# Patient Record
Sex: Female | Born: 1980 | Marital: Single | Smoking: Current every day smoker
Health system: Southern US, Community
[De-identification: ages and names within clinical notes are randomized; demographics above are authoritative.]

## PROBLEM LIST (undated history)

## (undated) ENCOUNTER — Inpatient Hospital Stay (HOSPITAL_COMMUNITY): Payer: Self-pay

## (undated) DIAGNOSIS — R011 Cardiac murmur, unspecified: Secondary | ICD-10-CM

## (undated) HISTORY — PX: TONSILLECTOMY: SUR1361

## (undated) HISTORY — PX: TONSILLECTOMY AND ADENOIDECTOMY: SHX28

## (undated) HISTORY — PX: SKIN GRAFT: SHX250

---

## 2012-12-22 NOTE — L&D Delivery Note (Signed)
Delivery Note Came up from MAU after arriving EMS in active labor. Was ready to push when arrived. No time for IV or other prep.  At 12:14 AM a viable and healthy female was delivered via Vaginal, Spontaneous Delivery (Presentation: OA ).  APGAR: 8, 9; weight 7+10  Placenta status: Intact, Spontaneous.  Cord: 3 vessels with the following complications: None.    Anesthesia: None  Episiotomy: None Lacerations:  Suture Repair: n/a Est. Blood Loss ( ):   Mom to postpartum.  Baby to nursery-stable.  Wynelle Bourgeois 09/28/2013, 2:42 AM

## 2012-12-24 ENCOUNTER — Emergency Department (HOSPITAL_COMMUNITY)
Admission: EM | Admit: 2012-12-24 | Discharge: 2012-12-25 | Disposition: A | Discharge status: Home / Self Care | Payer: Self-pay | Attending: Emergency Medicine | Admitting: Emergency Medicine

## 2012-12-24 ENCOUNTER — Encounter (HOSPITAL_COMMUNITY): Payer: Self-pay | Admitting: *Deleted

## 2012-12-24 DIAGNOSIS — R011 Cardiac murmur, unspecified: Secondary | ICD-10-CM | POA: Insufficient documentation

## 2012-12-24 DIAGNOSIS — Z3202 Encounter for pregnancy test, result negative: Secondary | ICD-10-CM | POA: Insufficient documentation

## 2012-12-24 DIAGNOSIS — N898 Other specified noninflammatory disorders of vagina: Secondary | ICD-10-CM | POA: Insufficient documentation

## 2012-12-24 DIAGNOSIS — F17200 Nicotine dependence, unspecified, uncomplicated: Secondary | ICD-10-CM | POA: Insufficient documentation

## 2012-12-24 HISTORY — DX: Cardiac murmur, unspecified: R01.1

## 2012-12-24 LAB — POCT PREGNANCY, URINE: Preg Test, Ur: NEGATIVE

## 2012-12-24 LAB — URINALYSIS, ROUTINE W REFLEX MICROSCOPIC
Bilirubin Urine: NEGATIVE
Hgb urine dipstick: NEGATIVE
Ketones, ur: NEGATIVE mg/dL
Nitrite: NEGATIVE
Specific Gravity, Urine: 1.019 (ref 1.005–1.030)
Urobilinogen, UA: 0.2 mg/dL (ref 0.0–1.0)

## 2012-12-24 NOTE — ED Provider Notes (Signed)
History     CSN: [id number]  Arrival date & time 12/24/12  2114   First MD Initiated Contact with Patient 12/24/12 2219      Chief Complaint  Patient presents with  . Vaginal Discharge     HPI The patient presents to emergency room complaining of vaginal discharge and itching for the last week. She does not notice any odor. The vaginal discharge is green colored. Patient's concerned she might have an STD. She does not live in this area and does not have a doctor so she came to the emergency department. She has had some lower abdominal cramping. No nausea vomiting fever. Past Medical History  Diagnosis Date  . Heart murmur     Past Surgical History  Procedure Date  . Tonsillectomy     History reviewed. No pertinent family history.  History  Substance Use Topics  . Smoking status: Current Every Day Smoker -- 1.0 packs/day  . Smokeless tobacco: Not on file  . Alcohol Use: Yes    OB History    Grav Para Term Preterm Abortions TAB SAB Ect Mult Living                  Review of Systems  All other systems reviewed and are negative.    Allergies  Review of patient's allergies indicates no known allergies.  Home Medications  No current outpatient prescriptions on file.  BP 113/81  Pulse 98  Temp 98.2 F (36.8 C) (Oral)  Resp 18  SpO2 97%  Physical Exam  Nursing note and vitals reviewed. Constitutional: She appears well-developed and well-nourished. No distress.  HENT:  Head: Normocephalic and atraumatic.  Right Ear: External ear normal.  Left Ear: External ear normal.  Eyes: Conjunctivae normal are normal. Right eye exhibits no discharge. Left eye exhibits no discharge. No scleral icterus.  Neck: Neck supple. No tracheal deviation present.  Cardiovascular: Normal rate, regular rhythm and intact distal pulses.   Pulmonary/Chest: Effort normal and breath sounds normal. No stridor. No respiratory distress. She has no wheezes. She has no rales.  Abdominal:  Soft. Bowel sounds are normal. She exhibits no distension. There is no tenderness. There is no rebound and no guarding.  Genitourinary: Uterus is not tender. Cervix exhibits discharge. Cervix exhibits no motion tenderness. Right adnexum displays no mass. Left adnexum displays no mass. No signs of injury around the vagina. Vaginal discharge found.  Musculoskeletal: She exhibits no edema and no tenderness.  Neurological: She is alert. She has normal strength. No sensory deficit. Cranial nerve deficit:  no gross defecits noted. She exhibits normal muscle tone. She displays no seizure activity. Coordination normal.  Skin: Skin is warm and dry. No rash noted.  Psychiatric: She has a normal mood and affect.    ED Course  Procedures (including critical care time)  Labs Reviewed  URINALYSIS, ROUTINE W REFLEX MICROSCOPIC - Abnormal; Notable for the following:    APPearance CLOUDY (*)     All other components within normal limits  WET PREP, GENITAL - Abnormal; Notable for the following:    Clue Cells Wet Prep HPF POC FEW (*)     WBC, Wet Prep HPF POC FEW (*)     All other components within normal limits  POCT PREGNANCY, URINE  GC/CHLAMYDIA PROBE AMP  RPR   No results found.   1. Vaginal discharge       MDM  Pt with clue cells and vaginal discharge.  Will cover for STDs and treat BV  Celene Kras, MD 12/25/12 [phone]

## 2012-12-24 NOTE — ED Notes (Signed)
Pt c/o vaginal discharge and itching x 1 week.  States discharge is "cream colored" and there is no odor.

## 2012-12-25 LAB — WET PREP, GENITAL: Trich, Wet Prep: NONE SEEN

## 2012-12-25 MED ORDER — AZITHROMYCIN 250 MG PO TABS
1000.0000 mg | ORAL_TABLET | Freq: Once | ORAL | Status: AC
  Administered 2012-12-25: 1000 mg via ORAL
  Filled 2012-12-25: qty 4

## 2012-12-25 MED ORDER — METRONIDAZOLE 500 MG PO TABS: 2000.0000 mg | ORAL_TABLET | Freq: Once | ORAL | Status: DC

## 2012-12-25 MED ORDER — CEFTRIAXONE SODIUM 250 MG IJ SOLR
250.0000 mg | Freq: Once | INTRAMUSCULAR | Status: AC
  Administered 2012-12-25: 250 mg via INTRAMUSCULAR
  Filled 2012-12-25: qty 250

## 2012-12-25 NOTE — ED Notes (Addendum)
Pt and visitor given soda and crackers per request. Updated on plan of care: Aware that blood was just drawn and that when the results are ready the EDP will come and discuss them. No further needs at this time.

## 2012-12-25 NOTE — ED Notes (Signed)
Pt A.O. X 4. NAD. Verbalized understanding of diagnosis. Verbalized need to have sexual partners tested. Verbalized warning signs that infection has worsened or returned. Vitals stable. Skin warm and dry. Ambulatory. Respirations even and regular. No further needs at this time.

## 2012-12-26 LAB — GC/CHLAMYDIA PROBE AMP: CT Probe RNA: NEGATIVE

## 2013-05-19 ENCOUNTER — Emergency Department (HOSPITAL_COMMUNITY): Payer: Medicaid Other

## 2013-05-19 ENCOUNTER — Encounter (HOSPITAL_COMMUNITY): Payer: Self-pay | Admitting: Emergency Medicine

## 2013-05-19 ENCOUNTER — Emergency Department (HOSPITAL_COMMUNITY)
Admission: EM | Admit: 2013-05-19 | Discharge: 2013-05-19 | Disposition: A | Discharge status: Home / Self Care | Payer: Medicaid Other | Attending: Emergency Medicine | Admitting: Emergency Medicine

## 2013-05-19 DIAGNOSIS — N949 Unspecified condition associated with female genital organs and menstrual cycle: Secondary | ICD-10-CM | POA: Insufficient documentation

## 2013-05-19 DIAGNOSIS — N760 Acute vaginitis: Secondary | ICD-10-CM | POA: Insufficient documentation

## 2013-05-19 DIAGNOSIS — R011 Cardiac murmur, unspecified: Secondary | ICD-10-CM | POA: Insufficient documentation

## 2013-05-19 DIAGNOSIS — N390 Urinary tract infection, site not specified: Secondary | ICD-10-CM | POA: Insufficient documentation

## 2013-05-19 DIAGNOSIS — B9689 Other specified bacterial agents as the cause of diseases classified elsewhere: Secondary | ICD-10-CM

## 2013-05-19 DIAGNOSIS — O2390 Unspecified genitourinary tract infection in pregnancy, unspecified trimester: Secondary | ICD-10-CM | POA: Insufficient documentation

## 2013-05-19 DIAGNOSIS — N898 Other specified noninflammatory disorders of vagina: Secondary | ICD-10-CM | POA: Insufficient documentation

## 2013-05-19 DIAGNOSIS — R000 Tachycardia, unspecified: Secondary | ICD-10-CM | POA: Insufficient documentation

## 2013-05-19 DIAGNOSIS — O9989 Other specified diseases and conditions complicating pregnancy, childbirth and the puerperium: Secondary | ICD-10-CM | POA: Insufficient documentation

## 2013-05-19 DIAGNOSIS — O99330 Smoking (tobacco) complicating pregnancy, unspecified trimester: Secondary | ICD-10-CM | POA: Insufficient documentation

## 2013-05-19 DIAGNOSIS — R109 Unspecified abdominal pain: Secondary | ICD-10-CM | POA: Insufficient documentation

## 2013-05-19 LAB — TYPE AND SCREEN: ABO/RH(D): O NEG

## 2013-05-19 LAB — URINALYSIS, ROUTINE W REFLEX MICROSCOPIC
Glucose, UA: NEGATIVE mg/dL
Hgb urine dipstick: NEGATIVE
Ketones, ur: NEGATIVE mg/dL
Protein, ur: NEGATIVE mg/dL
Urobilinogen, UA: 0.2 mg/dL (ref 0.0–1.0)

## 2013-05-19 LAB — WET PREP, GENITAL

## 2013-05-19 LAB — POCT PREGNANCY, URINE: Preg Test, Ur: POSITIVE — AB

## 2013-05-19 MED ORDER — RHO D IMMUNE GLOBULIN 1500 UNIT/2ML IJ SOLN
300.0000 ug | Freq: Once | INTRAMUSCULAR | Status: AC
  Administered 2013-05-19: 300 ug via INTRAMUSCULAR

## 2013-05-19 MED ORDER — SODIUM CHLORIDE 0.9 % IV SOLN
1000.0000 mL | INTRAVENOUS | Status: DC
  Administered 2013-05-19: 1000 mL via INTRAVENOUS

## 2013-05-19 MED ORDER — CEFTRIAXONE SODIUM 250 MG IJ SOLR
250.0000 mg | INTRAMUSCULAR | Status: DC
  Administered 2013-05-19: 250 mg via INTRAMUSCULAR
  Filled 2013-05-19: qty 250

## 2013-05-19 MED ORDER — AZITHROMYCIN 1 G PO PACK
1.0000 g | PACK | Freq: Once | ORAL | Status: AC
  Administered 2013-05-19: 1 g via ORAL
  Filled 2013-05-19: qty 1

## 2013-05-19 MED ORDER — CEPHALEXIN 500 MG PO CAPS: 500.0000 mg | ORAL_CAPSULE | Freq: Four times a day (QID) | ORAL | Status: DC

## 2013-05-19 MED ORDER — METRONIDAZOLE 500 MG PO TABS: 500.0000 mg | ORAL_TABLET | Freq: Two times a day (BID) | ORAL | Status: DC

## 2013-05-19 NOTE — Progress Notes (Signed)
[facility] has seen patient and provided her with pc resources.

## 2013-05-19 NOTE — ED Notes (Signed)
Per patient, abdominal pain, vaginal discharge/odor for past couple of days-thinks she is 4-5 months pregnant-states small amount of spotting last night-has not felt the baby move in 2 days-is not currently receiving prenatal care

## 2013-05-19 NOTE — ED Provider Notes (Addendum)
History     CSN: [id number]  Arrival date & time 05/19/13  1535   First MD Initiated Contact with Patient 05/19/13 1601      Chief Complaint  Patient presents with  . Abdominal Pain    (Consider location/radiation/quality/duration/timing/severity/associated sxs/prior treatment) Patient is a 32 y.o. female presenting with abdominal pain. The history is provided by the patient.  Abdominal Pain Associated symptoms include abdominal pain.   patient here complaining of lower abdominal pain with vaginal discharge x3 days. Process appeared was in January and she states that she had a recent stress test that was positive. She is G5 P3 with one miscarriage. She notes some vaginal spotting without uterine cramping. No fever chills. Has had emesis times one. Is not taking prenatal vitamins has not had any prenatal care  Past Medical History  Diagnosis Date  . Heart murmur     Past Surgical History  Procedure Laterality Date  . Tonsillectomy      No family history on file.  History  Substance Use Topics  . Smoking status: Current Every Day Smoker -- 1.00 packs/day  . Smokeless tobacco: Not on file  . Alcohol Use: Yes    OB History   Grav Para Term Preterm Abortions TAB SAB Ect Mult Living   1               Review of Systems  Gastrointestinal: Positive for abdominal pain.  All other systems reviewed and are negative.    Allergies  Review of patient's allergies indicates no known allergies.  Home Medications  No current outpatient prescriptions on file.  BP 121/66  Pulse 108  Temp(Src) 98.8 F (37.1 C) (Oral)  Resp 22  SpO2 98%  Physical Exam  Nursing note and vitals reviewed. Constitutional: She is oriented to person, place, and time. She appears well-developed and well-nourished.  Non-toxic appearance. No distress.  HENT:  Head: Normocephalic and atraumatic.  Eyes: Conjunctivae, EOM and lids are normal. Pupils are equal, round, and reactive to light.  Neck:  Normal range of motion. Neck supple. No tracheal deviation present. No mass present.  Cardiovascular: Regular rhythm and normal heart sounds.  Tachycardia present.  Exam reveals no gallop.   No murmur heard. Pulmonary/Chest: Effort normal and breath sounds normal. No stridor. No respiratory distress. She has no decreased breath sounds. She has no wheezes. She has no rhonchi. She has no rales.  Abdominal: Soft. Normal appearance and bowel sounds are normal. She exhibits no distension. There is tenderness in the suprapubic area. There is no rigidity, no rebound, no guarding and no CVA tenderness.  Genitourinary: There is no rash on the right labia. No bleeding around the vagina. Vaginal discharge found.  Musculoskeletal: Normal range of motion. She exhibits no edema and no tenderness.  Neurological: She is alert and oriented to person, place, and time. She has normal strength. No cranial nerve deficit or sensory deficit. GCS eye subscore is 4. GCS verbal subscore is 5. GCS motor subscore is 6.  Skin: Skin is warm and dry. No abrasion and no rash noted.  Psychiatric: She has a normal mood and affect. Her speech is normal and behavior is normal.    ED Course  Procedures (including critical care time)  Labs Reviewed  WET PREP, GENITAL  GC/CHLAMYDIA PROBE AMP  URINALYSIS, ROUTINE W REFLEX MICROSCOPIC  HCG, QUANTITATIVE, PREGNANCY   No results found.   No diagnosis found.    MDM  Patient states that she is concerned that she  have contracted an STI do to the fact that the condom broke. Patient to be given dose of Rocephin and Zithromax. She has a UTI as well as bacterial vaginosis. Patient to be treated for that as well   Patient is also Rh negative and was given RhoGAM    Toy Baker, MD 05/19/13 1820  Toy Baker, MD 05/19/13 [phone]

## 2013-05-20 LAB — RH IG WORKUP (INCLUDES ABO/RH)
ABO/RH(D): O NEG
Unit division: 0
Unit tag comment: 25

## 2013-05-20 LAB — URINE CULTURE: Colony Count: NO GROWTH

## 2013-05-20 LAB — GC/CHLAMYDIA PROBE AMP: GC Probe RNA: NEGATIVE

## 2013-08-09 ENCOUNTER — Inpatient Hospital Stay (HOSPITAL_COMMUNITY)
Admission: AD | Admit: 2013-08-09 | Discharge: 2013-08-09 | Disposition: A | Discharge status: Home / Self Care | Payer: Medicaid Other | Source: Ambulatory Visit | Attending: Obstetrics & Gynecology | Admitting: Obstetrics & Gynecology

## 2013-08-09 ENCOUNTER — Encounter (HOSPITAL_COMMUNITY): Payer: Self-pay

## 2013-08-09 ENCOUNTER — Inpatient Hospital Stay (HOSPITAL_COMMUNITY): Payer: Medicaid Other

## 2013-08-09 DIAGNOSIS — O0930 Supervision of pregnancy with insufficient antenatal care, unspecified trimester: Secondary | ICD-10-CM | POA: Insufficient documentation

## 2013-08-09 DIAGNOSIS — Z3490 Encounter for supervision of normal pregnancy, unspecified, unspecified trimester: Secondary | ICD-10-CM

## 2013-08-09 DIAGNOSIS — O368190 Decreased fetal movements, unspecified trimester, not applicable or unspecified: Secondary | ICD-10-CM | POA: Insufficient documentation

## 2013-08-09 DIAGNOSIS — N930 Postcoital and contact bleeding: Secondary | ICD-10-CM

## 2013-08-09 DIAGNOSIS — N949 Unspecified condition associated with female genital organs and menstrual cycle: Secondary | ICD-10-CM | POA: Insufficient documentation

## 2013-08-09 DIAGNOSIS — O26899 Other specified pregnancy related conditions, unspecified trimester: Secondary | ICD-10-CM

## 2013-08-09 DIAGNOSIS — F1410 Cocaine abuse, uncomplicated: Secondary | ICD-10-CM | POA: Insufficient documentation

## 2013-08-09 DIAGNOSIS — O4690 Antepartum hemorrhage, unspecified, unspecified trimester: Secondary | ICD-10-CM | POA: Insufficient documentation

## 2013-08-09 DIAGNOSIS — R109 Unspecified abdominal pain: Secondary | ICD-10-CM | POA: Insufficient documentation

## 2013-08-09 DIAGNOSIS — O99340 Other mental disorders complicating pregnancy, unspecified trimester: Secondary | ICD-10-CM | POA: Insufficient documentation

## 2013-08-09 LAB — RPR: RPR Ser Ql: NONREACTIVE

## 2013-08-09 LAB — CBC
HCT: 36.7 % (ref 36.0–46.0)
Hemoglobin: 12.3 g/dL (ref 12.0–15.0)
MCV: 94.6 fL (ref 78.0–100.0)
RBC: 3.88 MIL/uL (ref 3.87–5.11)
WBC: 12.2 10*3/uL — ABNORMAL HIGH (ref 4.0–10.5)

## 2013-08-09 LAB — DIFFERENTIAL
Basophils Absolute: 0 10*3/uL (ref 0.0–0.1)
Eosinophils Relative: 1 % (ref 0–5)
Lymphocytes Relative: 22 % (ref 12–46)
Lymphs Abs: 2.7 10*3/uL (ref 0.7–4.0)
Monocytes Absolute: 0.9 10*3/uL (ref 0.1–1.0)
Monocytes Relative: 8 % (ref 3–12)
Neutro Abs: 8.4 10*3/uL — ABNORMAL HIGH (ref 1.7–7.7)

## 2013-08-09 LAB — RAPID URINE DRUG SCREEN, HOSP PERFORMED
Amphetamines: NOT DETECTED
Barbiturates: NOT DETECTED
Benzodiazepines: NOT DETECTED
Cocaine: POSITIVE — AB

## 2013-08-09 LAB — URINALYSIS, ROUTINE W REFLEX MICROSCOPIC
Bilirubin Urine: NEGATIVE
Hgb urine dipstick: NEGATIVE
Ketones, ur: NEGATIVE mg/dL
Nitrite: NEGATIVE
pH: 7 (ref 5.0–8.0)

## 2013-08-09 LAB — OB RESULTS CONSOLE GC/CHLAMYDIA
Chlamydia: NEGATIVE
Gonorrhea: NEGATIVE

## 2013-08-09 LAB — GLUCOSE TOLERANCE, 1 HOUR: Glucose, 1 Hour GTT: 188 mg/dL — ABNORMAL HIGH (ref 70–140)

## 2013-08-09 LAB — WET PREP, GENITAL: Trich, Wet Prep: NONE SEEN

## 2013-08-09 MED ORDER — RHO D IMMUNE GLOBULIN 1500 UNIT/2ML IJ SOLN
300.0000 ug | Freq: Once | INTRAMUSCULAR | Status: AC
  Administered 2013-08-09: 300 ug via INTRAMUSCULAR
  Filled 2013-08-09: qty 2

## 2013-08-09 MED ORDER — CALCIUM CARBONATE ANTACID 500 MG PO CHEW: 1.0000 | CHEWABLE_TABLET | Freq: Every day | ORAL | Status: DC

## 2013-08-09 NOTE — Progress Notes (Signed)
D. Poe CNM in to discuss test results and d/c plan. Written and verbal d/c instructions given and understanding voiced.

## 2013-08-09 NOTE — MAU Note (Signed)
1/2 bottle of glucola drank and lab called. Will draw pt's labs at 1400. Pt aware. Pt now states has spotting after IC.

## 2013-08-09 NOTE — MAU Note (Signed)
Patient states she has recently moved to [place]. Has had no prenatal care. States she has felt decreased fetal movement. Had vomiting yesterday and had a little discharge with vomiting. Has had some spotting, none today. Has some mid abdominal pain off and on.

## 2013-08-09 NOTE — MAU Note (Signed)
Rhophylac literature given to pt. Pt has had it before and voices understanding

## 2013-08-09 NOTE — MAU Provider Note (Signed)
History    CSN: [id number]  Arrival date and time: 08/09/13 1130   First Provider Initiated Contact with Patient 08/09/13 1215      Chief Complaint  Patient presents with  . Decreased Fetal Movement  . Abdominal Pain  . Emesis  . Vaginal Discharge   HPI Comments: [name] [name] is a 32 y.o. [id number] presents today for multiple complaints.  She is [redacted]w[redacted]d by [place]. She moved from [place] recently and has not been able to establish prenatal care for this pregnancy.  She complains of post coital spotting x 3 last week.  She describes the spotting as minimal and light pink, and it is found on the toilet paper after wiping.  She denies any other vaginal discharge.  She complains of abdominal cramping in the right and left lower quadrant that began two days ago.  It occurs 1 to 2 times per hour and she rates the severity as 4-5.  The timing of the contractions is unpredictable. She has had some nausea with this pregnancy, but she denies it today. She has a negative health history for hypertension and diabetes.  She smokes 1 ppd, reports using marijuana, and denies alcohol.        Past Medical History  Diagnosis Date  . Heart murmur     Past Surgical History  Procedure Laterality Date  . Tonsillectomy      Family History  Problem Relation Age of Onset  . Alcohol abuse Neg Hx     History  Substance Use Topics  . Smoking status: Current Every Day Smoker -- 1.00 packs/day  . Smokeless tobacco: Not on file  . Alcohol Use: No    Allergies: No Known Allergies  Prescriptions prior to admission  Medication Sig Dispense Refill  . Prenatal Vit-Fe Fumarate-FA (MULTIVITAMIN-PRENATAL) 27-0.8 MG TABS tablet Take 1 tablet by mouth daily at 12 noon.        Review of Systems  Constitutional: Negative for fever, chills, weight loss, malaise/fatigue and diaphoresis.  HENT: Negative for hearing loss.   Eyes: Negative for blurred vision and double vision.  Respiratory: Negative for cough,  hemoptysis, sputum production, shortness of breath and wheezing.   Cardiovascular: Negative for chest pain, palpitations, orthopnea, claudication and PND.  Gastrointestinal: Positive for abdominal pain. Negative for heartburn, nausea, vomiting, diarrhea and constipation.  Genitourinary: Negative for dysuria, urgency and frequency.  Skin: Negative for itching.  Neurological: Negative for dizziness, tingling, tremors, sensory change, speech change, weakness and headaches.   Physical Exam   Blood pressure 120/86, pulse 95, temperature 98.1 F (36.7 C), temperature source Oral, resp. rate 20, height 5\' 5"  (1.651 m), weight 104.418 kg (230 lb 3.2 oz), SpO2 97.00%.  Physical Exam  Constitutional: She is oriented to person, place, and time. She appears well-developed and well-nourished. No distress.  Eyes: Right eye exhibits no discharge. Left eye exhibits no discharge.  Neck: Neck supple. No tracheal deviation present.  Cardiovascular: Normal rate, regular rhythm, normal heart sounds and intact distal pulses.   Respiratory: Effort normal and breath sounds normal.  GI: Soft. Bowel sounds are normal.  Neurological: She is alert and oriented to person, place, and time. She has normal reflexes. No cranial nerve deficit or sensory deficit.  Skin: Skin is warm, dry and intact. Lesion ( There is a round, 1 cm erythematous lesion just above the right gluteal fold.  ) noted. She is not diaphoretic.  Psychiatric: She has a normal mood and affect. Her speech is normal and behavior is  normal. Judgment and thought content normal. Cognition and memory are normal.    MAU Course  Procedures Results for orders placed during the hospital encounter of 08/09/13 (from the past 24 hour(s))  URINALYSIS, ROUTINE W REFLEX MICROSCOPIC     Status: Abnormal   Collection Time    08/09/13 11:55 AM      Result Value Range   Color, Urine YELLOW  YELLOW   APPearance CLOUDY (*) CLEAR   Specific Gravity, Urine 1.020  1.005  - 1.030   pH 7.0  5.0 - 8.0   Glucose, UA NEGATIVE  NEGATIVE mg/dL   Hgb urine dipstick NEGATIVE  NEGATIVE   Bilirubin Urine NEGATIVE  NEGATIVE   Ketones, ur NEGATIVE  NEGATIVE mg/dL   Protein, ur NEGATIVE  NEGATIVE mg/dL   Urobilinogen, UA 0.2  0.0 - 1.0 mg/dL   Nitrite NEGATIVE  NEGATIVE   Leukocytes, UA NEGATIVE  NEGATIVE  URINE RAPID DRUG SCREEN (HOSP PERFORMED)     Status: Abnormal   Collection Time    08/09/13 11:55 AM      Result Value Range   Opiates NONE DETECTED  NONE DETECTED   Cocaine POSITIVE (*) NONE DETECTED   Benzodiazepines NONE DETECTED  NONE DETECTED   Amphetamines NONE DETECTED  NONE DETECTED   Tetrahydrocannabinol POSITIVE (*) NONE DETECTED   Barbiturates NONE DETECTED  NONE DETECTED  WET PREP, GENITAL     Status: None   Collection Time    08/09/13  1:00 PM      Result Value Range   Yeast Wet Prep HPF POC NONE SEEN  NONE SEEN   Trich, Wet Prep NONE SEEN  NONE SEEN   Clue Cells Wet Prep HPF POC NONE SEEN  NONE SEEN   WBC, Wet Prep HPF POC NONE SEEN  NONE SEEN  RH IG WORKUP (INCLUDES ABO/RH)     Status: None   Collection Time    08/09/13  2:02 PM      Result Value Range   Gestational Age(Wks) 32     ABO/RH(D) O NEG     Fetal Screen PENDING     Antibody Screen PENDING    CBC     Status: Abnormal   Collection Time    08/09/13  2:02 PM      Result Value Range   WBC 12.2 (*) 4.0 - 10.5 K/uL   RBC 3.88  3.87 - 5.11 MIL/uL   Hemoglobin 12.3  12.0 - 15.0 g/dL   HCT 13.0  86.5 - 78.4 %   MCV 94.6  78.0 - 100.0 fL   MCH 31.7  26.0 - 34.0 pg   MCHC 33.5  30.0 - 36.0 g/dL   RDW 69.6  29.5 - 28.4 %   Platelets 219  150 - 400 K/uL  DIFFERENTIAL     Status: Abnormal   Collection Time    08/09/13  2:02 PM      Result Value Range   Neutrophils Relative % 69  43 - 77 %   Neutro Abs 8.4 (*) 1.7 - 7.7 K/uL   Lymphocytes Relative 22  12 - 46 %   Lymphs Abs 2.7  0.7 - 4.0 K/uL   Monocytes Relative 8  3 - 12 %   Monocytes Absolute 0.9  0.1 - 1.0 K/uL    Eosinophils Relative 1  0 - 5 %   Eosinophils Absolute 0.2  0.0 - 0.7 K/uL   Basophils Relative 0  0 - 1 %   Basophils Absolute  0.0  0.0 - 0.1 K/uL      Assessment and Plan   Problem List Items Addressed This Visit   None    Visit Diagnoses   PCB (post coital bleeding)    -  Primary    Abdominal pain in pregnancy        Normal IUP (intrauterine pregnancy) on prenatal ultrasound          Abdominal pain / Tylenol OTC1-2 tabs PO q4-6h prn Post coital bleeding / Follow up in GYN clinic as scheduled Normal IUP at 33w / Continue to follow up at GYN clinic for scheduled appointments.     CLARK, MICHAEL L 08/09/2013, 3:13 PM   Evaluation and management procedures were performed by PA-S under my supervision/collaboration. Chart reviewed, patient examined by me and I agree with management and plan except also:  32 yo G4P2012 at [redacted]w[redacted]d dated by 20 wk scan NPC  Substance abuse (cocaine, marijuana)   Abnormal 1 hr GTT (188) > 3hr GTT this week Rh neg> Rhophylac today  Follow-up Information   Follow up with [facility] On 08/25/2013. Vira Browns from [facility] will call you with appointment)    Specialty:  Obstetrics and Gynecology   Contact information:   [address] [place] [address] [phone]       Medication List    STOP taking these medications       bismuth subsalicylate 262 MG chewable tablet  Commonly known as:  PEPTO BISMOL      TAKE these medications       calcium carbonate 500 MG chewable tablet  Commonly known as:  TUMS  Chew 1 tablet (200 mg of elemental calcium total) by mouth daily.     multivitamin-prenatal 27-0.8 MG Tabs tablet  Take 1 tablet by mouth daily at 12 noon.       Danae Orleans, CNM 08/09/2013 4:33 PM

## 2013-08-09 NOTE — MAU Note (Signed)
Pt vomited and friend states she does this everyday. Pt hungry and requests ginger ale. Crackers and ginger ale given

## 2013-08-09 NOTE — MAU Note (Signed)
OK not to reapply EFM per D. Poe CNM when returns from u/s

## 2013-08-10 LAB — RH IG WORKUP (INCLUDES ABO/RH)
ABO/RH(D): O NEG
Antibody Screen: NEGATIVE
Fetal Screen: NEGATIVE
Unit division: 0

## 2013-08-10 LAB — GC/CHLAMYDIA PROBE AMP: CT Probe RNA: NEGATIVE

## 2013-08-11 NOTE — MAU Provider Note (Signed)
Attestation of Attending Supervision of Advanced Practitioner (CNM/NP): Evaluation and management procedures were performed by the Advanced Practitioner under my supervision and collaboration.  I have reviewed the Advanced Practitioner's note and chart, and I agree with the management and plan.  HARRAWAY-SMITH, Gabrial Domine 2:13 PM     

## 2013-08-15 ENCOUNTER — Telehealth: Payer: Self-pay | Admitting: General Practice

## 2013-08-15 NOTE — Telephone Encounter (Signed)
Message copied by Kathee Delton on Mon Aug 15, 2013  3:44 PM ------      Message from: Danae Orleans      Created: Tue Aug 09, 2013  4:34 PM       I think we have already set her up for appointment. (33 wks, NPC, substance abuse) Had 188 on 1 hr so should come for 3 hr this week. US done today. Had Rhophylac today ------

## 2013-08-15 NOTE — Telephone Encounter (Signed)
Spoke with patient she stated that she will come in on Wednesday at 730. Advised patient that she will need to be fasting.

## 2013-08-15 NOTE — Telephone Encounter (Signed)
Called patient, and the patient's sister answered and asked who is calling. I stated its a nurse from the hospital needing to speak with her and is she available. The patient's sister stated that she was not at home at the moment but when she gets home she will have [name] [name] call us back.

## 2013-08-17 ENCOUNTER — Other Ambulatory Visit: Payer: Self-pay

## 2013-08-25 ENCOUNTER — Ambulatory Visit (INDEPENDENT_AMBULATORY_CARE_PROVIDER_SITE_OTHER): Payer: Medicaid Other | Admitting: Family Medicine

## 2013-08-25 ENCOUNTER — Other Ambulatory Visit (HOSPITAL_COMMUNITY)
Admission: RE | Admit: 2013-08-25 | Discharge: 2013-08-25 | Disposition: A | Discharge status: Home / Self Care | Payer: Medicaid Other | Source: Ambulatory Visit | Attending: Family Medicine | Admitting: Family Medicine

## 2013-08-25 ENCOUNTER — Encounter: Payer: Self-pay | Admitting: Family Medicine

## 2013-08-25 VITALS — BP 139/86 | Temp 97.1°F | Ht 67.0 in | Wt 227.0 lb

## 2013-08-25 DIAGNOSIS — Z6791 Unspecified blood type, Rh negative: Secondary | ICD-10-CM | POA: Insufficient documentation

## 2013-08-25 DIAGNOSIS — Z113 Encounter for screening for infections with a predominantly sexual mode of transmission: Secondary | ICD-10-CM | POA: Insufficient documentation

## 2013-08-25 DIAGNOSIS — O0930 Supervision of pregnancy with insufficient antenatal care, unspecified trimester: Secondary | ICD-10-CM

## 2013-08-25 DIAGNOSIS — O99332 Smoking (tobacco) complicating pregnancy, second trimester: Secondary | ICD-10-CM

## 2013-08-25 DIAGNOSIS — Z01419 Encounter for gynecological examination (general) (routine) without abnormal findings: Secondary | ICD-10-CM | POA: Insufficient documentation

## 2013-08-25 DIAGNOSIS — O99330 Smoking (tobacco) complicating pregnancy, unspecified trimester: Secondary | ICD-10-CM

## 2013-08-25 DIAGNOSIS — O360131 Maternal care for anti-D [Rh] antibodies, third trimester, fetus 1: Secondary | ICD-10-CM

## 2013-08-25 DIAGNOSIS — O360990 Maternal care for other rhesus isoimmunization, unspecified trimester, not applicable or unspecified: Secondary | ICD-10-CM

## 2013-08-25 DIAGNOSIS — Z1151 Encounter for screening for human papillomavirus (HPV): Secondary | ICD-10-CM | POA: Insufficient documentation

## 2013-08-25 DIAGNOSIS — O0933 Supervision of pregnancy with insufficient antenatal care, third trimester: Secondary | ICD-10-CM | POA: Insufficient documentation

## 2013-08-25 DIAGNOSIS — O99810 Abnormal glucose complicating pregnancy: Secondary | ICD-10-CM

## 2013-08-25 LAB — POCT URINALYSIS DIP (DEVICE)
Bilirubin Urine: NEGATIVE
Ketones, ur: NEGATIVE mg/dL
Leukocytes, UA: NEGATIVE
pH: 6.5 (ref 5.0–8.0)

## 2013-08-25 MED ORDER — TETANUS-DIPHTH-ACELL PERTUSSIS 5-2.5-18.5 LF-MCG/0.5 IM SUSP
0.5000 mL | Freq: Once | INTRAMUSCULAR | Status: AC
  Administered 2013-08-25: 0.5 mL via INTRAMUSCULAR

## 2013-08-25 NOTE — Progress Notes (Signed)
   Subjective:    [name] [name] is a [id number] [redacted]w[redacted]d being seen today for her first obstetrical visit.  Her obstetrical history is significant for excessive weight gain, obesity, smoker and late to care, Rh neg.  Pregnancy history fully reviewed.  Patient reports occasional contractions.  Filed Vitals:   08/25/13 1018  BP: 139/86  Temp: 97.1 F (36.2 C)  Weight: 227 lb (102.967 kg)    HISTORY: OB History  Gravida Para Term Preterm AB SAB TAB Ectopic Multiple Living  5 3 3  1 1    3     # Outcome Date GA Lbr Len/2nd Weight Sex Delivery Anes PTL Lv  5 CUR           4 TRM 12/04/09 [redacted]w[redacted]d   F SVD None  Y  3 TRM 07/24/07 [redacted]w[redacted]d  6 lb 7 oz (2.92 kg) F SVD EPI  Y  2 SAB 2008          1 TRM 02/13/02 [redacted]w[redacted]d  6 lb 7 oz (2.92 kg) M SVD EPI  Y     Past Medical History  Diagnosis Date  . Heart murmur    Past Surgical History  Procedure Laterality Date  . Tonsillectomy    . Skin graft    . Tonsillectomy and adenoidectomy     Family History  Problem Relation Age of Onset  . Alcohol abuse Neg Hx   . Diabetes Maternal Grandmother      Exam    Uterus:   FH 35  Pelvic Exam:    Perineum: Normal Perineum   Vulva: Bartholin's, Urethra, Skene's normal   Vagina:  normal mucosa, normal discharge   Cervix: multiparous appearance   Adnexa: normal adnexa   Bony Pelvis: average  System: Breast:  normal appearance, no masses or tenderness   Skin: normal coloration and turgor, no rashes    Neurologic: oriented   Extremities: normal strength, tone, and muscle mass   HEENT sclera clear, anicteric   Mouth/Teeth mucous membranes moist, pharynx normal without lesions   Neck supple   Cardiovascular: regular rate and rhythm, no murmurs or gallops   Respiratory:  appears well, vitals normal, no respiratory distress, acyanotic, normal RR, ear and throat exam is normal, neck free of mass or lymphadenopathy, chest clear, no wheezing, crepitations, rhonchi, normal symmetric air entry   Abdomen:  soft, non-tender; bowel sounds normal; no masses,  no organomegaly   U/S shows: vtx, AFI 18, EFW 4 lb 12 oz 79% 8/19   Assessment:    Pregnancy: [id number] Patient Active Problem List   Diagnosis Date Noted  . Rh negative state in antepartum period 08/25/2013  . Late prenatal care complicating pregnancy in third trimester 08/25/2013  . Tobacco use complicating pregnancy 08/25/2013        Plan:     Initial labs drawn. Prenatal vitamins. Problem list reviewed and updated. Genetic Screening discussed First Screen: too late.  Ultrasound discussed; fetal survey: results reviewed.  Follow up in 2 weeks. 3 hour ASAP TDaP.     PRATT,TANYA S 08/25/2013

## 2013-08-25 NOTE — Progress Notes (Signed)
Nutrition note: 1st visit consult Pt has gained 48# @ [redacted]w[redacted]d, which is > expected. Pt reports eating 3 meals & 2-3 snacks/d.  Pt is taking PNV. Pt reports occ heartburn but no N/V. Pt received verbal & written education on general nutrition during pregnancy. Discussed tips to decrease heartburn. Discussed benefits of BF. Encouraged decreasing portion sizes. Discussed wt gain goals of 15-25# or 0.6#/wk. Pt agrees to continue taking PNV. Pt does not have WIC but plans to apply. Pt is unsure about BF. F/u if referred Blondell Reveal, MS, RD, LDN

## 2013-08-25 NOTE — Progress Notes (Signed)
Pulse- 84  Pain/pressure-bladder  New ob packet given Weight gain 11-20lbs

## 2013-08-25 NOTE — Patient Instructions (Addendum)
Smoking Cessation Quitting smoking is important to your health and has many advantages. However, it is not always easy to quit since nicotine is a very addictive drug. Often times, people try 3 times or more before being able to quit. This document explains the best ways for you to prepare to quit smoking. Quitting takes hard work and a lot of effort, but you can do it. ADVANTAGES OF QUITTING SMOKING  You will live longer, feel better, and live better.  Your body will feel the impact of quitting smoking almost immediately.  Within 20 minutes, blood pressure decreases. Your pulse returns to its normal level.  After 8 hours, carbon monoxide levels in the blood return to normal. Your oxygen level increases.  After 24 hours, the chance of having a heart attack starts to decrease. Your breath, hair, and body stop smelling like smoke.  After 48 hours, damaged nerve endings begin to recover. Your sense of taste and smell improve.  After 72 hours, the body is virtually free of nicotine. Your bronchial tubes relax and breathing becomes easier.  After 2 to 12 weeks, lungs can hold more air. Exercise becomes easier and circulation improves.  The risk of having a heart attack, stroke, cancer, or lung disease is greatly reduced.  After 1 year, the risk of coronary heart disease is cut in half.  After 5 years, the risk of stroke falls to the same as a nonsmoker.  After 10 years, the risk of lung cancer is cut in half and the risk of other cancers decreases significantly.  After 15 years, the risk of coronary heart disease drops, usually to the level of a nonsmoker.  If you are pregnant, quitting smoking will improve your chances of having a healthy baby.  The people you live with, especially any children, will be healthier.  You will have extra money to spend on things other than cigarettes. QUESTIONS TO THINK ABOUT BEFORE ATTEMPTING TO QUIT You may want to talk about your answers with your  caregiver.  Why do you want to quit?  If you tried to quit in the past, what helped and what did not?  What will be the most difficult situations for you after you quit? How will you plan to handle them?  Who can help you through the tough times? Your family? Friends? A caregiver?  What pleasures do you get from smoking? What ways can you still get pleasure if you quit? Here are some questions to ask your caregiver:  How can you help me to be successful at quitting?  What medicine do you think would be best for me and how should I take it?  What should I do if I need more help?  What is smoking withdrawal like? How can I get information on withdrawal? GET READY  Set a quit date.  Change your environment by getting rid of all cigarettes, ashtrays, matches, and lighters in your home, car, or work. Do not let people smoke in your home.  Review your past attempts to quit. Think about what worked and what did not. GET SUPPORT AND ENCOURAGEMENT You have a better chance of being successful if you have help. You can get support in many ways.  Tell your family, friends, and co-workers that you are going to quit and need their support. Ask them not to smoke around you.  Get individual, group, or telephone counseling and support. Programs are available at local hospitals and health centers. Call your local health department for   information about programs in your area.  Spiritual beliefs and practices may help some smokers quit.  Download a "quit meter" on your computer to keep track of quit statistics, such as how long you have gone without smoking, cigarettes not smoked, and money saved.  Get a self-help book about quitting smoking and staying off of tobacco. LEARN NEW SKILLS AND BEHAVIORS  Distract yourself from urges to smoke. Talk to someone, go for a walk, or occupy your time with a task.  Change your normal routine. Take a different route to work. Drink tea instead of coffee.  Eat breakfast in a different place.  Reduce your stress. Take a hot bath, exercise, or read a book.  Plan something enjoyable to do every day. Reward yourself for not smoking.  Explore interactive web-based programs that specialize in helping you quit. GET MEDICINE AND USE IT CORRECTLY Medicines can help you stop smoking and decrease the urge to smoke. Combining medicine with the above behavioral methods and support can greatly increase your chances of successfully quitting smoking.  Nicotine replacement therapy helps deliver nicotine to your body without the negative effects and risks of smoking. Nicotine replacement therapy includes nicotine gum, lozenges, inhalers, nasal sprays, and skin patches. Some may be available over-the-counter and others require a prescription.  Antidepressant medicine helps people abstain from smoking, but how this works is unknown. This medicine is available by prescription.  Nicotinic receptor partial agonist medicine simulates the effect of nicotine in your brain. This medicine is available by prescription. Ask your caregiver for advice about which medicines to use and how to use them based on your health history. Your caregiver will tell you what side effects to look out for if you choose to be on a medicine or therapy. Carefully read the information on the package. Do not use any other product containing nicotine while using a nicotine replacement product.  RELAPSE OR DIFFICULT SITUATIONS Most relapses occur within the first 3 months after quitting. Do not be discouraged if you start smoking again. Remember, most people try several times before finally quitting. You may have symptoms of withdrawal because your body is used to nicotine. You may crave cigarettes, be irritable, feel very hungry, cough often, get headaches, or have difficulty concentrating. The withdrawal symptoms are only temporary. They are strongest when you first quit, but they will go away within  10 14 days. To reduce the chances of relapse, try to:  Avoid drinking alcohol. Drinking lowers your chances of successfully quitting.  Reduce the amount of caffeine you consume. Once you quit smoking, the amount of caffeine in your body increases and can give you symptoms, such as a rapid heartbeat, sweating, and anxiety.  Avoid smokers because they can make you want to smoke.  Do not let weight gain distract you. Many smokers will gain weight when they quit, usually less than 10 pounds. Eat a healthy diet and stay active. You can always lose the weight gained after you quit.  Find ways to improve your mood other than smoking. FOR MORE INFORMATION  www.smokefree.gov  Document Released: 12/02/2001 Document Revised: 06/08/2012 Document Reviewed: 03/18/2012 [facility] Patient Information 2014 [place], [place].  Gestational Diabetes Mellitus Gestational diabetes mellitus, often simply referred to as gestational diabetes, is a type of diabetes that some women develop during pregnancy. In gestational diabetes, the pancreas does not make enough insulin (a hormone), the cells are less responsive to the insulin that is made (insulin resistance), or both.Normally, insulin moves sugars from food into  the tissue cells. The tissue cells use the sugars for energy. The lack of insulin or the lack of normal response to insulin causes excess sugars to build up in the blood instead of going into the tissue cells. As a result, high blood sugar (hyperglycemia) develops. The effect of high sugar (glucose) levels can cause many complications.  RISK FACTORS You have an increased chance of developing gestational diabetes if you have a family history of diabetes and also have one or more of the following risk factors:  A body mass index over 30 (obesity).  A previous pregnancy with gestational diabetes.  An older age at the time of pregnancy. If blood glucose levels are kept in the normal range during pregnancy,  women can have a healthy pregnancy. If your blood glucose levels are not well controlled, there may be risks to you, your unborn baby (fetus), your labor and delivery, or your newborn baby.  SYMPTOMS  If symptoms are experienced, they are much like symptoms you would normally expect during pregnancy. The symptoms of gestational diabetes include:   Increased thirst (polydipsia).  Increased urination (polyuria).  Increased urination during the night (nocturia).  Weight loss. This weight loss may be rapid.  Frequent, recurring infections.  Tiredness (fatigue).  Weakness.  Vision changes, such as blurred vision.  Fruity smell to your breath.  Abdominal pain. DIAGNOSIS Diabetes is diagnosed when blood glucose levels are increased. Your blood glucose level may be checked by one or more of the following blood tests:  A fasting blood glucose test. You will not be allowed to eat for at least 8 hours before a blood sample is taken.  A random blood glucose test. Your blood glucose is checked at any time of the day regardless of when you ate.  A hemoglobin A1c blood glucose test. A hemoglobin A1c test provides information about blood glucose control over the previous 3 months.  An oral glucose tolerance test (OGTT). Your blood glucose is measured after you have not eaten (fasted) for 1 3 hours and then after you drink a glucose-containing beverage. Since the hormones that cause insulin resistance are highest at about 24 28 weeks of a pregnancy, an OGTT is usually performed during that time. If you have risk factors for gestational diabetes, your caregiver may test you for gestational diabetes earlier than 24 weeks of pregnancy. TREATMENT   You will need to take diabetes medicine or insulin daily to keep blood glucose levels in the desired range.  You will need to match insulin dosing with exercise and healthy food choices. The treatment goal is to maintain the before meal (preprandial),  bedtime, and overnight blood glucose level at 60 99 mg/dL during pregnancy. The treatment goal is to further maintain peak after meal blood sugar (postprandial glucose) level at 100 140 mg/dL.  HOME CARE INSTRUCTIONS   Have your hemoglobin A1c level checked twice a year.  Perform daily blood glucose monitoring as directed by your caregiver. It is common to perform frequent blood glucose monitoring.  Monitor urine ketones when you are ill and as directed by your caregiver.  Take your diabetes medicine and insulin as directed by your caregiver to maintain your blood glucose level in the desired range.  Never run out of diabetes medicine or insulin. It is needed every day.  Adjust insulin based on your intake of carbohydrates. Carbohydrates can raise blood glucose levels but need to be included in your diet. Carbohydrates provide vitamins, minerals, and fiber which are an essential  part of a healthy diet. Carbohydrates are found in fruits, vegetables, whole grains, dairy products, legumes, and foods containing added sugars.    Eat healthy foods. Alternate 3 meals with 3 snacks.  Maintain a healthy weight gain. The usual total expected weight gain varies according to your prepregnancy body mass index (BMI).  Carry a medical alert card or wear your medical alert jewelry.  Carry a 15 gram carbohydrate snack with you at all times to treat low blood glucose (hypoglycemia). Some examples of 15 gram carbohydrate snacks include:  Glucose tablets, 3 or 4   Glucose gel, 15 gram tube  Raisins, 2 tablespoons (24 g)  Jelly beans, 6  Animal crackers, 8  Fruit juice, regular soda, or low fat milk, 4 ounces (120 mL)  Gummy treats, 9    Recognize hypoglycemia. Hypoglycemia during pregnancy occurs with blood glucose levels of 60 mg/dL and below. The risk for hypoglycemia increases when fasting or skipping meals, during or after intense exercise, and during sleep. Hypoglycemia symptoms can  include:  Tremors or shakes.  Decreased ability to concentrate.  Sweating.  Increased heart rate.  Headache.  Dry mouth.  Hunger.  Irritability.  Anxiety.  Restless sleep.  Altered speech or coordination.  Confusion.  Treat hypoglycemia promptly. If you are alert and able to safely swallow, follow the 15:15 rule:  Take 15 20 grams of rapid-acting glucose or carbohydrate. Rapid-acting options include glucose gel, glucose tablets, or 4 ounces (120 mL) of fruit juice, regular soda, or low fat milk.  Check your blood glucose level 15 minutes after taking the glucose.   Take 15 20 grams more of glucose if the repeat blood glucose level is still 70 mg/dL or below.  Eat a meal or snack within 1 hour once blood glucose levels return to normal.  Be alert to polyuria and polydipsia which are early signs of hyperglycemia. An early awareness of hyperglycemia allows for prompt treatment. Treat hyperglycemia as directed by your caregiver.  Engage in at least 30 minutes of physical activity a day or as directed by your caregiver. Ten minutes of physical activity timed 30 minutes after each meal is encouraged to control postprandial blood glucose levels.  Adjust your insulin dosing and food intake as needed if you start a new exercise or sport.  Follow your sick day plan at any time you are unable to eat or drink as usual.  Avoid tobacco and alcohol use.  Follow up with your caregiver regularly.  Follow the advice of your caregiver regarding your prenatal and post-delivery (postpartum) appointments, meal planning, exercise, medicines, vitamins, blood tests, other medical tests, and physical activities.  Perform daily skin and foot care. Examine your skin and feet daily for cuts, bruises, redness, nail problems, bleeding, blisters, or sores.  Brush your teeth and gums at least twice a day and floss at least once a day. Follow up with your dentist regularly.  Schedule an eye exam  during the first trimester of your pregnancy or as directed by your caregiver.  Share your diabetes management plan with your workplace or school.  Stay up-to-date with immunizations.  Learn to manage stress.  Obtain ongoing diabetes education and support as needed. SEEK MEDICAL CARE IF:   You are unable to eat food or drink fluids for more than 6 hours.  You have nausea and vomiting for more than 6 hours.  You have a blood glucose level of 200 mg/dL and you have ketones in your urine.  There is a  change in mental status.  You develop vision problems.  You have a persistent headache.  You have upper abdominal pain or discomfort.  You develop an additional serious illness.  You have diarrhea for more than 6 hours.  You have been sick or have had a fever for a couple of days and are not getting better. SEEK IMMEDIATE MEDICAL CARE IF:   You have difficulty breathing.  You no longer feel the baby moving.  You are bleeding or have discharge from your vagina.  You start having premature contractions or labor. MAKE SURE YOU:  Understand these instructions.  Will watch your condition.  Will get help right away if you are not doing well or get worse. Document Released: 03/16/2001 Document Revised: 09/01/2012 Document Reviewed: 07/06/2012 [facility] Patient Information 2014 [place], [place].  Pregnancy - Third Trimester The third trimester of pregnancy (the last 3 months) is a period of the most rapid growth for you and your baby. The baby approaches a length of 20 inches and a weight of 6 to 10 pounds. The baby is adding on fat and getting ready for life outside your body. While inside, babies have periods of sleeping and waking, sucking thumbs, and hiccuping. You can often feel small contractions of the uterus. This is false labor. It is also called Braxton-Hicks contractions. This is like a practice for labor. The usual problems in this stage of pregnancy include more  difficulty breathing, swelling of the hands and feet from water retention, and having to urinate more often because of the uterus and baby pressing on your bladder.  PRENATAL EXAMS  Blood work may continue to be done during prenatal exams. These tests are done to check on your health and the probable health of your baby. Blood work is used to follow your blood levels (hemoglobin). Anemia (low hemoglobin) is common during pregnancy. Iron and vitamins are given to help prevent this. You may also continue to be checked for diabetes. Some of the past blood tests may be done again.  The size of the uterus is measured during each visit. This makes sure your baby is growing properly according to your pregnancy dates.  Your blood pressure is checked every prenatal visit. This is to make sure you are not getting toxemia.  Your urine is checked every prenatal visit for infection, diabetes, and protein.  Your weight is checked at each visit. This is done to make sure gains are happening at the suggested rate and that you and your baby are growing normally.  Sometimes, an ultrasound is performed to confirm the position and the proper growth and development of the baby. This is a test done that bounces harmless sound waves off the baby so your caregiver can more accurately determine a due date.  Discuss the type of pain medicine and anesthesia you will have during your labor and delivery.  Discuss the possibility and anesthesia if a cesarean section might be necessary.  Inform your caregiver if there is any mental or physical violence at home. Sometimes, a specialized non-stress test, contraction stress test, and biophysical profile are done to make sure the baby is not having a problem. Checking the amniotic fluid surrounding the baby is called an amniocentesis. The amniotic fluid is removed by sticking a needle into the belly (abdomen). This is sometimes done near the end of pregnancy if an early delivery  is required. In this case, it is done to help make sure the baby's lungs are mature enough for the  baby to live outside of the womb. If the lungs are not mature and it is unsafe to deliver the baby, an injection of cortisone medicine is given to the mother 1 to 2 days before the delivery. This helps the baby's lungs mature and makes it safer to deliver the baby. CHANGES OCCURING IN THE THIRD TRIMESTER OF PREGNANCY Your body goes through many changes during pregnancy. They vary from person to person. Talk to your caregiver about changes you notice and are concerned about.  During the last trimester, you have probably had an increase in your appetite. It is normal to have cravings for certain foods. This varies from person to person and pregnancy to pregnancy.  You may begin to get stretch marks on your hips, abdomen, and breasts. These are normal changes in the body during pregnancy. There are no exercises or medicines to take which prevent this change.  Constipation may be treated with a stool softener or adding bulk to your diet. Drinking lots of fluids, fiber in vegetables, fruits, and whole grains are helpful.  Exercising is also helpful. If you have been very active up until your pregnancy, most of these activities can be continued during your pregnancy. If you have been less active, it is helpful to start an exercise program such as walking. Consult your caregiver before starting exercise programs.  Avoid all smoking, alcohol, non-prescribed drugs, herbs and "street drugs" during your pregnancy. These chemicals affect the formation and growth of the baby. Avoid chemicals throughout the pregnancy to ensure the delivery of a healthy infant.  Backache, varicose veins, and hemorrhoids may develop or get worse.  You will tire more easily in the third trimester, which is normal.  The baby's movements may be stronger and more often.  You may become short of breath easily.  Your belly button may  stick out.  A yellow discharge may leak from your breasts called colostrum.  You may have a bloody mucus discharge. This usually occurs a few days to a week before labor begins. HOME CARE INSTRUCTIONS   Keep your caregiver's appointments. Follow your caregiver's instructions regarding medicine use, exercise, and diet.  During pregnancy, you are providing food for you and your baby. Continue to eat regular, well-balanced meals. Choose foods such as meat, fish, milk and other low fat dairy products, vegetables, fruits, and whole-grain breads and cereals. Your caregiver will tell you of the ideal weight gain.  A physical sexual relationship may be continued throughout pregnancy if there are no other problems such as early (premature) leaking of amniotic fluid from the membranes, vaginal bleeding, or belly (abdominal) pain.  Exercise regularly if there are no restrictions. Check with your caregiver if you are unsure of the safety of your exercises. Greater weight gain will occur in the last 2 trimesters of pregnancy. Exercising helps:  Control your weight.  Get you in shape for labor and delivery.  You lose weight after you deliver.  Rest a lot with legs elevated, or as needed for leg cramps or low back pain.  Wear a good support or jogging bra for breast tenderness during pregnancy. This may help if worn during sleep. Pads or tissues may be used in the bra if you are leaking colostrum.  Do not use hot tubs, steam rooms, or saunas.  Wear your seat belt when driving. This protects you and your baby if you are in an accident.  Avoid raw meat, cat litter boxes and soil used by cats. These carry germs  that can cause birth defects in the baby.  It is easier to leak urine during pregnancy. Tightening up and strengthening the pelvic muscles will help with this problem. You can practice stopping your urination while you are going to the bathroom. These are the same muscles you need to strengthen.  It is also the muscles you would use if you were trying to stop from passing gas. You can practice tightening these muscles up 10 times a set and repeating this about 3 times per day. Once you know what muscles to tighten up, do not perform these exercises during urination. It is more likely to cause an infection by backing up the urine.  Ask for help if you have financial, counseling, or nutritional needs during pregnancy. Your caregiver will be able to offer counseling for these needs as well as refer you for other special needs.  Make a list of emergency phone numbers and have them available.  Plan on getting help from family or friends when you go home from the hospital.  Make a trial run to the hospital.  Take prenatal classes with the father to understand, practice, and ask questions about the labor and delivery.  Prepare the baby's room or nursery.  Do not travel out of the city unless it is absolutely necessary and with the advice of your caregiver.  Wear only low or no heal shoes to have better balance and prevent falling. MEDICINES AND DRUG USE IN PREGNANCY  Take prenatal vitamins as directed. The vitamin should contain 1 milligram of folic acid. Keep all vitamins out of reach of children. Only a couple vitamins or tablets containing iron may be fatal to a baby or young child when ingested.  Avoid use of all medicines, including herbs, over-the-counter medicines, not prescribed or suggested by your caregiver. Only take over-the-counter or prescription medicines for pain, discomfort, or fever as directed by your caregiver. Do not use aspirin, ibuprofen or naproxen unless approved by your caregiver.  Let your caregiver also know about herbs you may be using.  Alcohol is related to a number of birth defects. This includes fetal alcohol syndrome. All alcohol, in any form, should be avoided completely. Smoking will cause low birth rate and premature babies.  Illegal drugs are very  harmful to the baby. They are absolutely forbidden. A baby born to an addicted mother will be addicted at birth. The baby will go through the same withdrawal an adult does. SEEK MEDICAL CARE IF: You have any concerns or worries during your pregnancy. It is better to call with your questions if you feel they cannot wait, rather than worry about them. SEEK IMMEDIATE MEDICAL CARE IF:   An unexplained oral temperature above 102 F (38.9 C) develops, or as your caregiver suggests.  You have leaking of fluid from the vagina. If leaking membranes are suspected, take your temperature and tell your caregiver of this when you call.  There is vaginal spotting, bleeding or passing clots. Tell your caregiver of the amount and how many pads are used.  You develop a bad smelling vaginal discharge with a change in the color from clear to white.  You develop vomiting that lasts more than 24 hours.  You develop chills or fever.  You develop shortness of breath.  You develop burning on urination.  You loose more than 2 pounds of weight or gain more than 2 pounds of weight or as suggested by your caregiver.  You notice sudden swelling of your face, hands,  and feet or legs.  You develop belly (abdominal) pain. Round ligament discomfort is a common non-cancerous (benign) cause of abdominal pain in pregnancy. Your caregiver still must evaluate you.  You develop a severe headache that does not go away.  You develop visual problems, blurred or double vision.  If you have not felt your baby move for more than 1 hour. If you think the baby is not moving as much as usual, eat something with sugar in it and lie down on your left side for an hour. The baby should move at least 4 to 5 times per hour. Call right away if your baby moves less than that.  You fall, are in a car accident, or any kind of trauma.  There is mental or physical violence at home. Document Released: 12/02/2001 Document Revised: 09/01/2012  Document Reviewed: 06/06/2009 [facility] Patient Information 2014 [place], [place].  Contraception Choices Contraception (birth control) is the use of any methods or devices to prevent pregnancy. Below are some methods to help avoid pregnancy. HORMONAL METHODS   Contraceptive implant. This is a thin, plastic tube containing progesterone hormone. It does not contain estrogen hormone. Your caregiver inserts the tube in the inner part of the upper arm. The tube can remain in place for up to 3 years. After 3 years, the implant must be removed. The implant prevents the ovaries from releasing an egg (ovulation), thickens the cervical mucus which prevents sperm from entering the uterus, and thins the lining of the inside of the uterus.  Progesterone-only injections. These injections are given every 3 months by your caregiver to prevent pregnancy. This synthetic progesterone hormone stops the ovaries from releasing eggs. It also thickens cervical mucus and changes the uterine lining. This makes it harder for sperm to survive in the uterus.  Birth control pills. These pills contain estrogen and progesterone hormone. They work by stopping the egg from forming in the ovary (ovulation). Birth control pills are prescribed by a caregiver.Birth control pills can also be used to treat heavy periods.  Minipill. This type of birth control pill contains only the progesterone hormone. They are taken every day of each month and must be prescribed by your caregiver.  Birth control patch. The patch contains hormones similar to those in birth control pills. It must be changed once a week and is prescribed by a caregiver.  Vaginal ring. The ring contains hormones similar to those in birth control pills. It is left in the vagina for 3 weeks, removed for 1 week, and then a new one is put back in place. The patient must be comfortable inserting and removing the ring from the vagina.A caregiver's prescription is  necessary.  Emergency contraception. Emergency contraceptives prevent pregnancy after unprotected sexual intercourse. This pill can be taken right after sex or up to 5 days after unprotected sex. It is most effective the sooner you take the pills after having sexual intercourse. Emergency contraceptive pills are available without a prescription. Check with your pharmacist. Do not use emergency contraception as your only form of birth control. BARRIER METHODS   Female condom. This is a thin sheath (latex or rubber) that is worn over the penis during sexual intercourse. It can be used with spermicide to increase effectiveness.  Female condom. This is a soft, loose-fitting sheath that is put into the vagina before sexual intercourse.  Diaphragm. This is a soft, latex, dome-shaped barrier that must be fitted by a caregiver. It is inserted into the vagina, along with a  spermicidal jelly. It is inserted before intercourse. The diaphragm should be left in the vagina for 6 to 8 hours after intercourse.  Cervical cap. This is a round, soft, latex or plastic cup that fits over the cervix and must be fitted by a caregiver. The cap can be left in place for up to 48 hours after intercourse.  Sponge. This is a soft, circular piece of polyurethane foam. The sponge has spermicide in it. It is inserted into the vagina after wetting it and before sexual intercourse.  Spermicides. These are chemicals that kill or block sperm from entering the cervix and uterus. They come in the form of creams, jellies, suppositories, foam, or tablets. They do not require a prescription. They are inserted into the vagina with an applicator before having sexual intercourse. The process must be repeated every time you have sexual intercourse. INTRAUTERINE CONTRACEPTION  Intrauterine device (IUD). This is a T-shaped device that is put in a woman's uterus during a menstrual period to prevent pregnancy. There are 2 types:  Copper IUD. This  type of IUD is wrapped in copper wire and is placed inside the uterus. Copper makes the uterus and fallopian tubes produce a fluid that kills sperm. It can stay in place for 10 years.  Hormone IUD. This type of IUD contains the hormone progestin (synthetic progesterone). The hormone thickens the cervical mucus and prevents sperm from entering the uterus, and it also thins the uterine lining to prevent implantation of a fertilized egg. The hormone can weaken or kill the sperm that get into the uterus. It can stay in place for 5 years. PERMANENT METHODS OF CONTRACEPTION  Female tubal ligation. This is when the woman's fallopian tubes are surgically sealed, tied, or blocked to prevent the egg from traveling to the uterus.  Female sterilization. This is when the female has the tubes that carry sperm tied off (vasectomy).This blocks sperm from entering the vagina during sexual intercourse. After the procedure, the man can still ejaculate fluid (semen). NATURAL PLANNING METHODS  Natural family planning. This is not having sexual intercourse or using a barrier method (condom, diaphragm, cervical cap) on days the woman could become pregnant.  Calendar method. This is keeping track of the length of each menstrual cycle and identifying when you are fertile.  Ovulation method. This is avoiding sexual intercourse during ovulation.  Symptothermal method. This is avoiding sexual intercourse during ovulation, using a thermometer and ovulation symptoms.  Post-ovulation method. This is timing sexual intercourse after you have ovulated. Regardless of which type or method of contraception you choose, it is important that you use condoms to protect against the transmission of sexually transmitted diseases (STDs). Talk with your caregiver about which form of contraception is most appropriate for you. Document Released: 12/08/2005 Document Revised: 03/01/2012 Document Reviewed: 04/16/2011 [facility] Patient Information  2014 [place], [place].  Breastfeeding A change in hormones during your pregnancy causes growth of your breast tissue and an increase in number and size of milk ducts. The hormone prolactin allows proteins, sugars, and fats from your blood supply to make breast milk in your milk-producing glands. The hormone progesterone prevents breast milk from being released before the birth of your baby. After the birth of your baby, your progesterone level decreases allowing breast milk to be released. Thoughts of your baby, as well as his or her sucking or crying, can stimulate the release of milk from the milk-producing glands. Deciding to breastfeed (nurse) is one of the best choices you can make  for you and your baby. The information that follows gives a brief review of the benefits, as well as other important skills to know about breastfeeding. BENEFITS OF BREASTFEEDING For your baby  The first milk (colostrum) helps your baby's digestive system function better.   There are antibodies in your milk that help your baby fight off infections.   Your baby has a lower incidence of asthma, allergies, and sudden infant death syndrome (SIDS).   The nutrients in breast milk are better for your baby than infant formulas.  Breast milk improves your baby's brain development.   Your baby will have less gas, colic, and constipation.  Your baby is less likely to develop other conditions, such as childhood obesity, asthma, or diabetes mellitus. For you  Breastfeeding helps develop a very special bond between you and your baby.   Breastfeeding is convenient, always available at the correct temperature, and costs nothing.   Breastfeeding helps to burn calories and helps you lose the weight gained during pregnancy.   Breastfeeding makes your uterus contract back down to normal size faster and slows bleeding following delivery.   Breastfeeding mothers have a lower risk of developing osteoporosis or breast or  ovarian cancer later in life.  BREASTFEEDING FREQUENCY  A healthy, full-term baby may breastfeed as often as every hour or space his or her feedings to every 3 hours. Breastfeeding frequency will vary from baby to baby.   Newborns should be fed no less than every 2 3 hours during the day and every 4 5 hours during the night. You should breastfeed a minimum of 8 feedings in a 24 hour period.  Awaken your baby to breastfeed if it has been 3 4 hours since the last feeding.  Breastfeed when you feel the need to reduce the fullness of your breasts or when your newborn shows signs of hunger. Signs that your baby may be hungry include:  Increased alertness or activity.  Stretching.  Movement of the head from side to side.  Movement of the head and opening of the mouth when the corner of the mouth or cheek is stroked (rooting).  Increased sucking sounds, smacking lips, cooing, sighing, or squeaking.  Hand-to-mouth movements.  Increased sucking of fingers or hands.  Fussing.  Intermittent crying.  Signs of extreme hunger will require calming and consoling before you try to feed your baby. Signs of extreme hunger may include:  Restlessness.  A loud, strong cry.  Screaming.  Frequent feeding will help you make more milk and will help prevent problems, such as sore nipples and engorgement of the breasts.  BREASTFEEDING   Whether lying down or sitting, be sure that the baby's abdomen is facing your abdomen.   Support your breast with 4 fingers under your breast and your thumb above your nipple. Make sure your fingers are well away from your nipple and your baby's mouth.   Stroke your baby's lips gently with your finger or nipple.   When your baby's mouth is open wide enough, place all of your nipple and as much of the colored area around your nipple (areola) as possible into your baby's mouth.  More areola should be visible above his or her upper lip than below his or her  lower lip.  Your baby's tongue should be between his or her lower gum and your breast.  Ensure that your baby's mouth is correctly positioned around the nipple (latched). Your baby's lips should create a seal on your breast.  Signs that  your baby has effectively latched onto your nipple include:  Tugging or sucking without pain.  Swallowing heard between sucks.  Absent click or smacking sound.  Muscle movement above and in front of his or her ears with sucking.  Your baby must suck about 2 3 minutes in order to get your milk. Allow your baby to feed on each breast as long as he or she wants. Nurse your baby until he or she unlatches or falls asleep at the first breast, then offer the second breast.  Signs that your baby is full and satisfied include:  A gradual decrease in the number of sucks or complete cessation of sucking.  Falling asleep.  Extension or relaxation of his or her body.  Retention of a small amount of milk in his or her mouth.  Letting go of your breast by himself or herself.  Signs of effective breastfeeding in you include:  Breasts that have increased firmness, weight, and size prior to feeding.  Breasts that are softer after nursing.  Increased milk volume, as well as a change in milk consistency and color by the 5th day of breastfeeding.  Breast fullness relieved by breastfeeding.  Nipples are not sore, cracked, or bleeding.  If needed, break the suction by putting your finger into the corner of your baby's mouth and sliding your finger between his or her gums. Then, remove your breast from his or her mouth.  It is common for babies to spit up a small amount after a feeding.  Babies often swallow air during feeding. This can make babies fussy. Burping your baby between breasts can help with this.  Vitamin D supplements are recommended for babies who get only breast milk.  Avoid using a pacifier during your baby's first 4 6 weeks.  Avoid  supplemental feedings of water, formula, or juice in place of breastfeeding. Breast milk is all the food your baby needs. It is not necessary for your baby to have water or formula. Your breasts will make more milk if supplemental feedings are avoided during the early weeks. HOW TO TELL WHETHER YOUR BABY IS GETTING ENOUGH BREAST MILK Wondering whether or not your baby is getting enough milk is a common concern among mothers. You can be assured that your baby is getting enough milk if:   Your baby is actively sucking and you hear swallowing.   Your baby seems relaxed and satisfied after a feeding.   Your baby nurses at least 8 12 times in a 24 hour time period.  During the first 56 52 days of age:  Your baby is wetting at least 3 5 diapers in a 24 hour period. The urine should be clear and pale yellow.  Your baby is having at least 3 4 stools in a 24 hour period. The stool should be soft and yellow.  At 107 68 days of age, your baby is having at least 3 6 stools in a 24 hour period. The stool should be seedy and yellow by 43 days of age.  Your baby has a weight loss less than 7 10% during the first 40 days of age.  Your baby does not lose weight after 54 84 days of age.  Your baby gains 4 7 ounces each week after he or she is 4 days of age.  Your baby gains weight by 40 days of age and is back to birth weight within 2 weeks. ENGORGEMENT In the first week after your baby is born, you may  experience extremely full breasts (engorgement). When engorged, your breasts may feel heavy, warm, or tender to the touch. Engorgement peaks within 24 48 hours after delivery of your baby.  Engorgement may be reduced by:  Continuing to breastfeed.  Increasing the frequency of breastfeeding.  Taking warm showers or applying warm, moist heat to your breasts just before each feeding. This increases circulation and helps the milk flow.   Gently massaging your breast before and during the feedings. With your  fingertips, massage from your chest wall towards your nipple in a circular motion.   Ensuring that your baby empties at least one breast at every feeding. It also helps to start the next feeding on the opposite breast.   Expressing breast milk by hand or by using a breast pump to empty the breasts if your baby is sleepy, or not nursing well. You may also want to express milk if you are returning to work oryou feel you are getting engorged.  Ensuring your baby is latched on and positioned properly while breastfeeding. If you follow these suggestions, your engorgement should improve in 24 48 hours. If you are still experiencing difficulty, call your lactation consultant or caregiver.  CARING FOR YOURSELF Take care of your breasts.  Bathe or shower daily.   Avoid using soap on your nipples.   Wear a supportive bra. Avoid wearing underwire style bras.  Air dry your nipples for a 3 after each feeding.   Use only cotton bra pads to absorb breast milk leakage. Leaking of breast milk between feedings is normal.   Use only pure lanolin on your nipples after nursing. You do not need to wash it off before feeding your baby again. Another option is to express a few drops of breast milk and gently massage that milk into your nipples.  Continue breast self-awareness checks. Take care of yourself.  Eat healthy foods. Alternate 3 meals with 3 snacks.  Avoid foods that you notice affect your baby in a bad way.  Drink milk, fruit juice, and water to satisfy your thirst (about 8 glasses a day).   Rest often, relax, and take your prenatal vitamins to prevent fatigue, stress, and anemia.  Avoid chewing and smoking tobacco.  Avoid alcohol and drug use.  Take over-the-counter and prescribed medicine only as directed by your caregiver or pharmacist. You should always check with your caregiver or pharmacist before taking any new medicine, vitamin, or herbal supplement.  Know that  pregnancy is possible while breastfeeding. If desired, talk to your caregiver about family planning and safe birth control methods that may be used while breastfeeding. SEEK MEDICAL CARE IF:   You feel like you want to stop breastfeeding or have become frustrated with breastfeeding.  You have painful breasts or nipples.  Your nipples are cracked or bleeding.  Your breasts are red, tender, or warm.  You have a swollen area on either breast.  You have a fever or chills.  You have nausea or vomiting.  You have drainage from your nipples.  Your breasts do not become full before feedings by the 5th day after delivery.  You feel sad and depressed.  Your baby is too sleepy to eat well.  Your baby is having trouble sleeping.   Your baby is wetting less than 3 diapers in a 24 hour period.  Your baby has less than 3 stools in a 24 hour period.  Your baby's skin or the white part of his or her eyes becomes more  yellow.   Your baby is not gaining weight by 66 days of age. MAKE SURE YOU:   Understand these instructions.  Will watch your condition.  Will get help right away if you are not doing well or get worse. Document Released: 12/08/2005 Document Revised: 09/01/2012 Document Reviewed: 07/14/2012 [facility] Patient Information 2014 [place], [place].

## 2013-08-28 LAB — CULTURE, OB URINE

## 2013-08-29 ENCOUNTER — Encounter: Payer: Self-pay | Admitting: Family Medicine

## 2013-08-29 ENCOUNTER — Other Ambulatory Visit: Payer: Self-pay | Admitting: Family Medicine

## 2013-08-29 DIAGNOSIS — O2340 Unspecified infection of urinary tract in pregnancy, unspecified trimester: Secondary | ICD-10-CM | POA: Insufficient documentation

## 2013-08-29 DIAGNOSIS — B951 Streptococcus, group B, as the cause of diseases classified elsewhere: Secondary | ICD-10-CM | POA: Insufficient documentation

## 2013-08-29 MED ORDER — AMOXICILLIN 500 MG PO CAPS: 500.0000 mg | ORAL_CAPSULE | Freq: Three times a day (TID) | ORAL | Status: DC

## 2013-08-29 NOTE — Progress Notes (Signed)
Called patient at home number and hung fax machine noise. Called patient at work number and message stated this person isn't receiving calls at this time.

## 2013-08-30 ENCOUNTER — Other Ambulatory Visit: Payer: Medicaid Other

## 2013-08-31 ENCOUNTER — Encounter: Payer: Self-pay | Admitting: General Practice

## 2013-08-31 ENCOUNTER — Encounter: Payer: Self-pay | Admitting: Obstetrics & Gynecology

## 2013-08-31 ENCOUNTER — Other Ambulatory Visit: Payer: Medicaid Other

## 2013-08-31 ENCOUNTER — Encounter: Payer: Self-pay | Admitting: *Deleted

## 2013-08-31 NOTE — Progress Notes (Signed)
Called patient at home number and fax machine picks up. Called patient at work number and message stated this person isn't receiving calls at this time. Will send patient letter

## 2013-09-01 ENCOUNTER — Encounter: Payer: Self-pay | Admitting: *Deleted

## 2013-09-05 ENCOUNTER — Encounter: Payer: Self-pay | Admitting: *Deleted

## 2013-09-05 ENCOUNTER — Other Ambulatory Visit: Payer: Medicaid Other

## 2013-09-06 ENCOUNTER — Encounter: Payer: Self-pay | Admitting: *Deleted

## 2013-09-07 ENCOUNTER — Encounter: Payer: Self-pay | Admitting: *Deleted

## 2013-09-07 ENCOUNTER — Other Ambulatory Visit: Payer: Medicaid Other

## 2013-09-07 DIAGNOSIS — O99810 Abnormal glucose complicating pregnancy: Secondary | ICD-10-CM

## 2013-09-08 ENCOUNTER — Telehealth: Payer: Self-pay | Admitting: General Practice

## 2013-09-08 ENCOUNTER — Encounter: Payer: Medicaid Other | Admitting: Family Medicine

## 2013-09-08 LAB — GLUCOSE TOLERANCE, 3 HOURS
Glucose Tolerance, 1 hour: 265 mg/dL — ABNORMAL HIGH (ref 70–189)
Glucose Tolerance, 2 hour: 183 mg/dL — ABNORMAL HIGH (ref 70–164)
Glucose Tolerance, Fasting: 109 mg/dL — ABNORMAL HIGH (ref 70–104)
Glucose, GTT - 3 Hour: 75 mg/dL (ref 70–144)

## 2013-09-08 NOTE — Telephone Encounter (Signed)
Patient called and left message stating she would like the results from her 3 hr

## 2013-09-13 MED ORDER — GLUCOSE BLOOD VI STRP: ORAL_STRIP | Status: DC

## 2013-09-13 MED ORDER — ACCU-CHEK NANO SMARTVIEW W/DEVICE KIT: 1.0000 | PACK | Freq: Four times a day (QID) | Status: DC

## 2013-09-13 MED ORDER — ACCU-CHEK FASTCLIX LANCETS MISC: 1.0000 | Freq: Four times a day (QID) | Status: DC

## 2013-09-13 NOTE — Progress Notes (Signed)
Called patient and informed her of results and that we have called in lancets, strips and a meter to her [facility] pharmacy for her to pickup and if she will bring that with her to her appt with the diabetic educator she will show her how to use everything and answer any questions she may have such as her diet. Patient verbalized understanding. Told patient the diabetic educator is only here on Mondays, would she like to come Monday for both the diabetic educator and her doctors visit or come to her appt on 9/25 and come back on Monday to meet with the diabetic educator. Patient stated she would keep her appt for Thursday and come back on Monday. Patient had no further questions

## 2013-09-13 NOTE — Telephone Encounter (Signed)
Addressed under different encounter.

## 2013-09-15 ENCOUNTER — Encounter: Payer: Medicaid Other | Admitting: Family Medicine

## 2013-09-15 ENCOUNTER — Encounter: Payer: Self-pay | Admitting: Obstetrics & Gynecology

## 2013-09-15 ENCOUNTER — Telehealth: Payer: Self-pay | Admitting: *Deleted

## 2013-09-15 DIAGNOSIS — B373 Candidiasis of vulva and vagina: Secondary | ICD-10-CM

## 2013-09-15 MED ORDER — FLUCONAZOLE 150 MG PO TABS: 150.0000 mg | ORAL_TABLET | Freq: Once | ORAL | Status: DC

## 2013-09-15 NOTE — Telephone Encounter (Signed)
Spoke with patient and addressed her concerns about gestational diabetes. Patient also stated that she has a yeast infection. Rx sent to her pharmacy.

## 2013-09-17 ENCOUNTER — Encounter: Payer: Self-pay | Admitting: *Deleted

## 2013-09-19 ENCOUNTER — Telehealth: Payer: Self-pay | Admitting: General Practice

## 2013-09-19 ENCOUNTER — Encounter: Payer: Medicaid Other | Admitting: Advanced Practice Midwife

## 2013-09-19 DIAGNOSIS — O99810 Abnormal glucose complicating pregnancy: Secondary | ICD-10-CM

## 2013-09-19 NOTE — Telephone Encounter (Signed)
Patient called stating she would like a call back as soon as possible, its about her diabetic kit. Called patient, no answer- left message stating we are returning your phone call, please call us back at the clinics

## 2013-09-20 NOTE — Telephone Encounter (Signed)
Called pt and discussed her concern. She stated that she is currently out of town and missed her Ob appt yesterday with the diabetes nurse. She has been checking her blood sugar and it ranges from 100-200's.  She has not had diet instruction yet and had not been given the usual diabetes education instructions as that was the reason for the appt which she missed yesterday.  Pt has not been checking her blood sugar at regular times- just randomly. I advised pt to check the blood sugar 4 times daily- fasting and 2 hrs after each meal. I also gave her basic diet instruction regarding no refined sugar food items or soda (pt states she is having several sodas daily).  Pt was advised that she may look on-line for Gestational Diabetes diet information.  She will need to have follow up US to assess fetal growth and this can be scheduled on 10/3 if she is able to return to [place].  Pt stated she can return and appt was made for 2:30 pm.  Pt was instructed to arrive at 2:15 and she voiced understanding

## 2013-09-23 ENCOUNTER — Ambulatory Visit (HOSPITAL_COMMUNITY): Payer: Medicaid Other

## 2013-09-26 ENCOUNTER — Encounter: Payer: Medicaid Other | Attending: Family Medicine | Admitting: *Deleted

## 2013-09-26 ENCOUNTER — Ambulatory Visit (HOSPITAL_COMMUNITY)
Admission: RE | Admit: 2013-09-26 | Discharge: 2013-09-26 | Disposition: A | Discharge status: Home / Self Care | Payer: Medicaid Other | Source: Ambulatory Visit | Attending: Obstetrics and Gynecology | Admitting: Obstetrics and Gynecology

## 2013-09-26 ENCOUNTER — Ambulatory Visit (INDEPENDENT_AMBULATORY_CARE_PROVIDER_SITE_OTHER): Payer: Medicaid Other | Admitting: Family Medicine

## 2013-09-26 ENCOUNTER — Encounter: Payer: Self-pay | Admitting: Family Medicine

## 2013-09-26 ENCOUNTER — Other Ambulatory Visit: Payer: Self-pay | Admitting: Obstetrics and Gynecology

## 2013-09-26 VITALS — BP 111/74 | Temp 96.8°F | Wt 231.5 lb

## 2013-09-26 DIAGNOSIS — O99810 Abnormal glucose complicating pregnancy: Secondary | ICD-10-CM

## 2013-09-26 DIAGNOSIS — O0933 Supervision of pregnancy with insufficient antenatal care, third trimester: Secondary | ICD-10-CM

## 2013-09-26 DIAGNOSIS — Z23 Encounter for immunization: Secondary | ICD-10-CM

## 2013-09-26 DIAGNOSIS — O99330 Smoking (tobacco) complicating pregnancy, unspecified trimester: Secondary | ICD-10-CM | POA: Insufficient documentation

## 2013-09-26 DIAGNOSIS — O0930 Supervision of pregnancy with insufficient antenatal care, unspecified trimester: Secondary | ICD-10-CM | POA: Insufficient documentation

## 2013-09-26 DIAGNOSIS — O2390 Unspecified genitourinary tract infection in pregnancy, unspecified trimester: Secondary | ICD-10-CM

## 2013-09-26 DIAGNOSIS — Z713 Dietary counseling and surveillance: Secondary | ICD-10-CM | POA: Insufficient documentation

## 2013-09-26 DIAGNOSIS — B951 Streptococcus, group B, as the cause of diseases classified elsewhere: Secondary | ICD-10-CM

## 2013-09-26 LAB — POCT URINALYSIS DIP (DEVICE)
Glucose, UA: NEGATIVE mg/dL
Nitrite: NEGATIVE
Protein, ur: 100 mg/dL — AB
Urobilinogen, UA: 1 mg/dL (ref 0.0–1.0)
pH: 6.5 (ref 5.0–8.0)

## 2013-09-26 NOTE — Progress Notes (Signed)
IOL scheduled 10/01/13 at 8 am.

## 2013-09-26 NOTE — Progress Notes (Signed)
Nutrition note: f/u for GDM diet education Pt has gained 52.5# @ [redacted]w[redacted]d, which is > expected. Pt has GDM & has been checking her BS- fasting: 91-97 & 2hr pp: 105-271 Pt reports eating 3 meals & 2-3 snacks/d. Pt reports drinking 1 can soda/d and juice. Pt is taking PNV most nights. Pt received verbal & written GDM diet education. Discussed goals of <90 for fasting & <120 2hr pp. Encouraged pt to discontinue soda & juice. Pt agrees to follow GDM diet with 3 meals & 2-3 snacks/d with proper CHO and protein combination.  Blondell Reveal, MS, RD, LDN

## 2013-09-26 NOTE — Patient Instructions (Addendum)
Gestational Diabetes Mellitus Gestational diabetes mellitus, often simply referred to as gestational diabetes, is a type of diabetes that some women develop during pregnancy. In gestational diabetes, the pancreas does not make enough insulin (a hormone), the cells are less responsive to the insulin that is made (insulin resistance), or both.Normally, insulin moves sugars from food into the tissue cells. The tissue cells use the sugars for energy. The lack of insulin or the lack of normal response to insulin causes excess sugars to build up in the blood instead of going into the tissue cells. As a result, high blood sugar (hyperglycemia) develops. The effect of high sugar (glucose) levels can cause many complications.  RISK FACTORS You have an increased chance of developing gestational diabetes if you have a family history of diabetes and also have one or more of the following risk factors:  A body mass index over 30 (obesity).  A previous pregnancy with gestational diabetes.  An older age at the time of pregnancy. If blood glucose levels are kept in the normal range during pregnancy, women can have a healthy pregnancy. If your blood glucose levels are not well controlled, there may be risks to you, your unborn baby (fetus), your labor and delivery, or your newborn baby.  SYMPTOMS  If symptoms are experienced, they are much like symptoms you would normally expect during pregnancy. The symptoms of gestational diabetes include:   Increased thirst (polydipsia).  Increased urination (polyuria).  Increased urination during the night (nocturia).  Weight loss. This weight loss may be rapid.  Frequent, recurring infections.  Tiredness (fatigue).  Weakness.  Vision changes, such as blurred vision.  Fruity smell to your breath.  Abdominal pain. DIAGNOSIS Diabetes is diagnosed when blood glucose levels are increased. Your blood glucose level may be checked by one or more of the following  blood tests:  A fasting blood glucose test. You will not be allowed to eat for at least 8 hours before a blood sample is taken.  A random blood glucose test. Your blood glucose is checked at any time of the day regardless of when you ate.  A hemoglobin A1c blood glucose test. A hemoglobin A1c test provides information about blood glucose control over the previous 3 months.  An oral glucose tolerance test (OGTT). Your blood glucose is measured after you have not eaten (fasted) for 1 3 hours and then after you drink a glucose-containing beverage. Since the hormones that cause insulin resistance are highest at about 24 28 weeks of a pregnancy, an OGTT is usually performed during that time. If you have risk factors for gestational diabetes, your caregiver may test you for gestational diabetes earlier than 24 weeks of pregnancy. TREATMENT   You will need to take diabetes medicine or insulin daily to keep blood glucose levels in the desired range.  You will need to match insulin dosing with exercise and healthy food choices. The treatment goal is to maintain the before meal (preprandial), bedtime, and overnight blood glucose level at 60 99 mg/dL during pregnancy. The treatment goal is to further maintain peak after meal blood sugar (postprandial glucose) level at 100 140 mg/dL.  HOME CARE INSTRUCTIONS   Have your hemoglobin A1c level checked twice a year.  Perform daily blood glucose monitoring as directed by your caregiver. It is common to perform frequent blood glucose monitoring.  Monitor urine ketones when you are ill and as directed by your caregiver.  Take your diabetes medicine and insulin as directed by your caregiver to   maintain your blood glucose level in the desired range.  Never run out of diabetes medicine or insulin. It is needed every day.  Adjust insulin based on your intake of carbohydrates. Carbohydrates can raise blood glucose levels but need to be included in your diet.  Carbohydrates provide vitamins, minerals, and fiber which are an essential part of a healthy diet. Carbohydrates are found in fruits, vegetables, whole grains, dairy products, legumes, and foods containing added sugars.    Eat healthy foods. Alternate 3 meals with 3 snacks.  Maintain a healthy weight gain. The usual total expected weight gain varies according to your prepregnancy body mass index (BMI).  Carry a medical alert card or wear your medical alert jewelry.  Carry a 15 gram carbohydrate snack with you at all times to treat low blood glucose (hypoglycemia). Some examples of 15 gram carbohydrate snacks include:  Glucose tablets, 3 or 4   Glucose gel, 15 gram tube  Raisins, 2 tablespoons (24 g)  Jelly beans, 6  Animal crackers, 8  Fruit juice, regular soda, or low fat milk, 4 ounces (120 mL)  Gummy treats, 9    Recognize hypoglycemia. Hypoglycemia during pregnancy occurs with blood glucose levels of 60 mg/dL and below. The risk for hypoglycemia increases when fasting or skipping meals, during or after intense exercise, and during sleep. Hypoglycemia symptoms can include:  Tremors or shakes.  Decreased ability to concentrate.  Sweating.  Increased heart rate.  Headache.  Dry mouth.  Hunger.  Irritability.  Anxiety.  Restless sleep.  Altered speech or coordination.  Confusion.  Treat hypoglycemia promptly. If you are alert and able to safely swallow, follow the 15:15 rule:  Take 15 20 grams of rapid-acting glucose or carbohydrate. Rapid-acting options include glucose gel, glucose tablets, or 4 ounces (120 mL) of fruit juice, regular soda, or low fat milk.  Check your blood glucose level 15 minutes after taking the glucose.   Take 15 20 grams more of glucose if the repeat blood glucose level is still 70 mg/dL or below.  Eat a meal or snack within 1 hour once blood glucose levels return to normal.  Be alert to polyuria and polydipsia which are early  signs of hyperglycemia. An early awareness of hyperglycemia allows for prompt treatment. Treat hyperglycemia as directed by your caregiver.  Engage in at least 30 minutes of physical activity a day or as directed by your caregiver. Ten minutes of physical activity timed 30 minutes after each meal is encouraged to control postprandial blood glucose levels.  Adjust your insulin dosing and food intake as needed if you start a new exercise or sport.  Follow your sick day plan at any time you are unable to eat or drink as usual.  Avoid tobacco and alcohol use.  Follow up with your caregiver regularly.  Follow the advice of your caregiver regarding your prenatal and post-delivery (postpartum) appointments, meal planning, exercise, medicines, vitamins, blood tests, other medical tests, and physical activities.  Perform daily skin and foot care. Examine your skin and feet daily for cuts, bruises, redness, nail problems, bleeding, blisters, or sores.  Brush your teeth and gums at least twice a day and floss at least once a day. Follow up with your dentist regularly.  Schedule an eye exam during the first trimester of your pregnancy or as directed by your caregiver.  Share your diabetes management plan with your workplace or school.  Stay up-to-date with immunizations.  Learn to manage stress.  Obtain ongoing diabetes education   and support as needed. SEEK MEDICAL CARE IF:   You are unable to eat food or drink fluids for more than 6 hours.  You have nausea and vomiting for more than 6 hours.  You have a blood glucose level of 200 mg/dL and you have ketones in your urine.  There is a change in mental status.  You develop vision problems.  You have a persistent headache.  You have upper abdominal pain or discomfort.  You develop an additional serious illness.  You have diarrhea for more than 6 hours.  You have been sick or have had a fever for a couple of days and are not getting  better. SEEK IMMEDIATE MEDICAL CARE IF:   You have difficulty breathing.  You no longer feel the baby moving.  You are bleeding or have discharge from your vagina.  You start having premature contractions or labor. MAKE SURE YOU:  Understand these instructions.  Will watch your condition.  Will get help right away if you are not doing well or get worse. Document Released: 03/16/2001 Document Revised: 09/01/2012 Document Reviewed: 07/06/2012 [facility] Patient Information 2014 [place], [place]. Labor Induction  Most women go into labor on their own between 74 and 42 weeks of the pregnancy. When this does not happen or when there is a medical need, medicine or other methods may be used to induce labor. Labor induction causes a pregnant woman's uterus to contract. It also causes the cervix to soften (ripen), open (dilate), and thin out (efface). Usually, labor is not induced before 39 weeks of the pregnancy unless there is a problem with the baby or mother. Whether your labor will be induced depends on a number of factors, including the following:  The medical condition of you and the baby.  How many weeks along you are.  The status of baby's lung maturity.  The condition of the cervix.  The position of the baby. REASONS FOR LABOR INDUCTION  The health of the baby or mother is at risk.  The pregnancy is overdue by 1 week or more.  The water breaks but labor does not start on its own.  The mother has a health condition or serious illness such as high blood pressure, infection, placental abruption, or diabetes.  The amniotic fluid amounts are low around the baby.  The baby is distressed. REASONS TO NOT INDUCE LABOR Labor induction may not be a good idea if:  It is shown that your baby does not tolerate labor.  An induction is just more convenient.  You want the baby to be born on a certain date, like a holiday.  You have had previous surgeries on your uterus, such as a  myomectomy or the removal of fibroids.  Your placenta lies very low in the uterus and blocks the opening of the cervix (placenta previa).  Your baby is not in a head down position.  The umbilical cord drops down into the birth canal in front of the baby. This could cut off the baby's blood and oxygen supply.  You have had a previous cesarean delivery.  There areunusual circumstances, such as the baby being extremely premature. RISKS AND COMPLICATIONS Problems may occur in the process of induction and plans may need to be modified as a situation unfolds. Some of the risks of induction include:  Change in fetal heart rate, such as too high, too low, or erratic.  Risk of fetal distress.  Risk of infection to mother and baby.  Increased chance of  having a cesarean delivery.  The rare, but increased chance that the placenta will separate from the uterus (abruption).  Uterine rupture (very rare). When induction is needed for medical reasons, the benefits of induction may outweigh the risks. BEFORE THE PROCEDURE Your caregiver will check your cervix and the baby's position. This will help your caregiver decide if you are far enough along for an induction to work. PROCEDURE Several methods of labor induction may be used, such as:   Taking prostaglandin medicine to dilate and ripen the cervix. The medicine will also start contractions. It can be taken by mouth or by inserting a suppository into the vagina.  A thin tube (catheter) with a balloon on the end may be inserted into your vagina to dilate the cervix. Once inserted, the balloon expands with water, which causes the cervix to open.  Striping the membranes. Your caregiver inserts a finger between the cervix and membranes, which causes the cervix to be stretched and may cause the uterus to contract. This is often done during an office visit. You will be sent home to wait for the contractions to begin. You will then come in for an  induction.  Breaking the water. Your caregiver will make a hole in the amniotic sac using a small instrument. Once the amniotic sac breaks, contractions should begin. This may still take hours to see an effect.  Taking medicine to trigger or strengthen contractions. This medicine is given intravenously through a tube in your arm. All of the methods of induction, besides stripping the membranes, will be done in the hospital. Induction is done in the hospital so that you and the baby can be carefully monitored. AFTER THE PROCEDURE Some inductions can take up to 2 or 3 days. Depending on the cervix, it usually takes less time. It takes longer when you are induced early in the pregnancy or if this is your first pregnancy. If a mother is still pregnant and the induction has been going on for 2 to 3 days, either the mother will be sent home or a cesarean delivery will be needed. Document Released: 04/29/2007 Document Revised: 03/01/2012 Document Reviewed: 10/13/2011 [facility] Patient Information 2014 [place], [place].

## 2013-09-26 NOTE — Progress Notes (Signed)
Will schedule IOL-for 39 wks--U/s today showed 8 lb baby 85%, AC ahead, AFI 16, vtx--pt failed to show up for 4 wks after GDM diagnosis wants to give baby up for adoption--has given previous baby up for adoption-has case worker. To see SW today Check FSBS today-83 Dietary counseling Sign BTL papers today--will be done intervally

## 2013-09-26 NOTE — Progress Notes (Signed)
P=  Pt. Reports mucous/yellow discharge, believes she may have passed her mucus plug yesterday. Reports lower abdominal/pelvic pressure. Reports having a bump in her perianal area that she would like to have checked.  Requests to have proof of pregnancy and due date faced over to case worker for adoption agency.

## 2013-09-27 ENCOUNTER — Encounter: Payer: Self-pay | Admitting: *Deleted

## 2013-09-27 ENCOUNTER — Inpatient Hospital Stay (HOSPITAL_COMMUNITY)
Admission: AD | Admit: 2013-09-27 | Discharge: 2013-09-30 | DRG: 775 | Disposition: A | Discharge status: Home / Self Care | Payer: Medicaid Other | Source: Ambulatory Visit | Attending: Obstetrics & Gynecology | Admitting: Obstetrics & Gynecology

## 2013-09-27 ENCOUNTER — Inpatient Hospital Stay (HOSPITAL_COMMUNITY): Payer: Medicaid Other

## 2013-09-27 DIAGNOSIS — O99334 Smoking (tobacco) complicating childbirth: Secondary | ICD-10-CM | POA: Diagnosis present

## 2013-09-27 DIAGNOSIS — O0930 Supervision of pregnancy with insufficient antenatal care, unspecified trimester: Secondary | ICD-10-CM

## 2013-09-27 DIAGNOSIS — F1210 Cannabis abuse, uncomplicated: Secondary | ICD-10-CM | POA: Diagnosis present

## 2013-09-27 DIAGNOSIS — O99344 Other mental disorders complicating childbirth: Secondary | ICD-10-CM | POA: Diagnosis present

## 2013-09-27 DIAGNOSIS — O99810 Abnormal glucose complicating pregnancy: Secondary | ICD-10-CM

## 2013-09-27 DIAGNOSIS — O99330 Smoking (tobacco) complicating pregnancy, unspecified trimester: Secondary | ICD-10-CM

## 2013-09-27 DIAGNOSIS — Z3483 Encounter for supervision of other normal pregnancy, third trimester: Secondary | ICD-10-CM

## 2013-09-27 DIAGNOSIS — O99814 Abnormal glucose complicating childbirth: Principal | ICD-10-CM | POA: Diagnosis present

## 2013-09-27 DIAGNOSIS — O0933 Supervision of pregnancy with insufficient antenatal care, third trimester: Secondary | ICD-10-CM

## 2013-09-27 DIAGNOSIS — F1410 Cocaine abuse, uncomplicated: Secondary | ICD-10-CM | POA: Clinically undetermined

## 2013-09-27 LAB — CULTURE, OB URINE

## 2013-09-28 ENCOUNTER — Encounter (HOSPITAL_COMMUNITY): Payer: Self-pay | Admitting: Advanced Practice Midwife

## 2013-09-28 DIAGNOSIS — Z3483 Encounter for supervision of other normal pregnancy, third trimester: Secondary | ICD-10-CM

## 2013-09-28 DIAGNOSIS — O0930 Supervision of pregnancy with insufficient antenatal care, unspecified trimester: Secondary | ICD-10-CM

## 2013-09-28 DIAGNOSIS — F1210 Cannabis abuse, uncomplicated: Secondary | ICD-10-CM

## 2013-09-28 DIAGNOSIS — O99814 Abnormal glucose complicating childbirth: Secondary | ICD-10-CM

## 2013-09-28 DIAGNOSIS — O99344 Other mental disorders complicating childbirth: Secondary | ICD-10-CM

## 2013-09-28 DIAGNOSIS — F1410 Cocaine abuse, uncomplicated: Secondary | ICD-10-CM | POA: Clinically undetermined

## 2013-09-28 DIAGNOSIS — O99334 Smoking (tobacco) complicating childbirth: Secondary | ICD-10-CM

## 2013-09-28 LAB — CBC
HCT: 35.9 % — ABNORMAL LOW (ref 36.0–46.0)
Hemoglobin: 12.4 g/dL (ref 12.0–15.0)
MCV: 90.4 fL (ref 78.0–100.0)
RBC: 3.97 MIL/uL (ref 3.87–5.11)
WBC: 16.1 10*3/uL — ABNORMAL HIGH (ref 4.0–10.5)

## 2013-09-28 MED ORDER — LANOLIN HYDROUS EX OINT: TOPICAL_OINTMENT | CUTANEOUS | Status: DC | PRN

## 2013-09-28 MED ORDER — TETANUS-DIPHTH-ACELL PERTUSSIS 5-2.5-18.5 LF-MCG/0.5 IM SUSP
0.5000 mL | Freq: Once | INTRAMUSCULAR | Status: AC
  Administered 2013-09-29: 0.5 mL via INTRAMUSCULAR

## 2013-09-28 MED ORDER — ONDANSETRON HCL 4 MG/2ML IJ SOLN: 4.0000 mg | INTRAMUSCULAR | Status: DC | PRN

## 2013-09-28 MED ORDER — ZOLPIDEM TARTRATE 5 MG PO TABS: 5.0000 mg | ORAL_TABLET | Freq: Every evening | ORAL | Status: DC | PRN

## 2013-09-28 MED ORDER — NICOTINE 21 MG/24HR TD PT24
21.0000 mg | MEDICATED_PATCH | Freq: Every day | TRANSDERMAL | Status: DC
  Administered 2013-09-28: 21 mg via TRANSDERMAL
  Filled 2013-09-28 (×3): qty 1

## 2013-09-28 MED ORDER — OXYCODONE-ACETAMINOPHEN 5-325 MG PO TABS
ORAL_TABLET | ORAL | Status: AC
  Administered 2013-09-28: 2
  Filled 2013-09-28: qty 2

## 2013-09-28 MED ORDER — SIMETHICONE 80 MG PO CHEW
80.0000 mg | CHEWABLE_TABLET | ORAL | Status: DC | PRN
  Administered 2013-09-29: 80 mg via ORAL
  Filled 2013-09-28: qty 1

## 2013-09-28 MED ORDER — PNEUMOCOCCAL VAC POLYVALENT 25 MCG/0.5ML IJ INJ
0.5000 mL | INJECTION | INTRAMUSCULAR | Status: DC
  Filled 2013-09-28: qty 0.5

## 2013-09-28 MED ORDER — PRENATAL MULTIVITAMIN CH
1.0000 | ORAL_TABLET | Freq: Every day | ORAL | Status: DC
  Administered 2013-09-28 – 2013-09-29 (×2): 1 via ORAL
  Filled 2013-09-28 (×2): qty 1

## 2013-09-28 MED ORDER — IBUPROFEN 600 MG PO TABS
ORAL_TABLET | ORAL | Status: AC
  Administered 2013-09-28: 01:00:00
  Filled 2013-09-28: qty 1

## 2013-09-28 MED ORDER — IBUPROFEN 600 MG PO TABS
600.0000 mg | ORAL_TABLET | Freq: Four times a day (QID) | ORAL | Status: DC | PRN
  Administered 2013-09-28 – 2013-09-30 (×6): 600 mg via ORAL
  Filled 2013-09-28 (×6): qty 1

## 2013-09-28 MED ORDER — OXYCODONE-ACETAMINOPHEN 5-325 MG PO TABS
1.0000 | ORAL_TABLET | Freq: Four times a day (QID) | ORAL | Status: DC | PRN
  Administered 2013-09-28 (×3): 1 via ORAL
  Administered 2013-09-28 – 2013-09-29 (×2): 2 via ORAL
  Administered 2013-09-29 (×2): 1 via ORAL
  Administered 2013-09-29 – 2013-09-30 (×3): 2 via ORAL
  Filled 2013-09-28 (×2): qty 1
  Filled 2013-09-28: qty 2
  Filled 2013-09-28: qty 1
  Filled 2013-09-28: qty 2
  Filled 2013-09-28: qty 1
  Filled 2013-09-28 (×3): qty 2
  Filled 2013-09-28 (×2): qty 1
  Filled 2013-09-28: qty 2
  Filled 2013-09-28: qty 1

## 2013-09-28 MED ORDER — WITCH HAZEL-GLYCERIN EX PADS: 1.0000 "application " | MEDICATED_PAD | CUTANEOUS | Status: DC | PRN

## 2013-09-28 MED ORDER — BENZOCAINE-MENTHOL 20-0.5 % EX AERO: 1.0000 "application " | INHALATION_SPRAY | CUTANEOUS | Status: DC | PRN

## 2013-09-28 MED ORDER — ONDANSETRON HCL 4 MG PO TABS: 4.0000 mg | ORAL_TABLET | ORAL | Status: DC | PRN

## 2013-09-28 MED ORDER — LIDOCAINE HCL (PF) 1 % IJ SOLN
INTRAMUSCULAR | Status: AC
  Filled 2013-09-28: qty 30

## 2013-09-28 MED ORDER — OXYTOCIN 10 UNIT/ML IJ SOLN
INTRAMUSCULAR | Status: AC
  Filled 2013-09-28: qty 1

## 2013-09-28 MED ORDER — DIBUCAINE 1 % RE OINT: 1.0000 "application " | TOPICAL_OINTMENT | RECTAL | Status: DC | PRN

## 2013-09-28 MED ORDER — SENNOSIDES-DOCUSATE SODIUM 8.6-50 MG PO TABS
2.0000 | ORAL_TABLET | ORAL | Status: DC
  Administered 2013-09-29 – 2013-09-30 (×2): 2 via ORAL
  Filled 2013-09-28 (×2): qty 2

## 2013-09-28 MED ORDER — DIPHENHYDRAMINE HCL 25 MG PO CAPS: 25.0000 mg | ORAL_CAPSULE | Freq: Four times a day (QID) | ORAL | Status: DC | PRN

## 2013-09-28 NOTE — Clinical Social Work Maternal (Signed)
Clinical Social Work Department  PSYCHOSOCIAL ASSESSMENT - MATERNAL/CHILD  09/28/2013  Patient: [name] [name] Account Number: [id number] Admit Date: 09/27/2013  Childs Name:  [name] [name]   Clinical Social Worker: Tywone Bembenek, LCSW Date/Time: 09/28/2013 01:32 PM  Date Referred: 09/28/2013  Referral source   CN    Referred reason   Substance Abuse   Adoption   Other referral source:  I: FAMILY / HOME ENVIRONMENT  Child's legal guardian: PARENT  Guardian - Name  Guardian - Age  Guardian - Address   [name] [name]  32  [address]; [place], [place] [address]   ?     Other household support members/support persons  Other support:  [name] [name], sister   II PSYCHOSOCIAL DATA  Information Source: Patient Interview  Financial and Community Resources  Employment:  Financial resources: Medicaid  If Medicaid - County: [place]  School / Grade:  Maternity Care Coordinator / Child Services Coordination / Early Interventions: Cultural issues impacting care:  III STRENGTHS  Strengths   Adequate Resources   Home prepared for Child (including basic supplies)   Supportive family/friends   Strength comment:  IV RISK FACTORS AND CURRENT PROBLEMS  Current Problem: YES  Risk Factor & Current Problem  Patient Issue  Family Issue  Risk Factor / Current Problem Comment   Substance Abuse  Y  N    Other - See comment  Y  N  Adoption   V SOCIAL WORK ASSESSMENT  CSW met with pt to assess her current social situation & facilitate adoption process. Pt is currently living with her sister, [name] [name], her sisters boyfriend & her 6 year old niece. Pt is originally from [place] but grew up in [place], [place]. She has given birth to 3 children. Her oldest son (DOB 02/13/02), is being raised by her maternal grandmother & her youngest child (DOB 12/04/09), lives with her aunt. Pt made an adoption plan with the baby she had in 2008. Initially, she denied any previous CPS involvement, as she  told CSW that she made a plan with family. Then pt told CSW that CPS was involved with her youngest daughter but could not verbalize the reason for their involvement. Pt has not cared for her daughter since she was 10 months old. Pt was incarcerated in [place] for 15 months (8/12-11/4) for "conspiracy to robbery" charges. After pt was released from prison, she was on probation. Pt told CSW that she violated probation & relocated to the area. She is not wanted by law enforcement, per pt. CSW inquired about substance abuse history. Pt admits to smoking MJ frequently but denies any other substance use. CSW inquired about + cocaine results in Aug. '14 & pt explained that it was in the "blunt" she smoked. She openly admits to MJ use but denies other illegal substance use. She has not smoked MJ in about 45-50 days. She suspects that the infant will be positive for MJ. Due to her substance use (MJ), pt decided to make an adoption plan last week. She contacted an agency & selected parents. The adoptive parents, [name] [name] & [name] [name] are on their way from [place], [place]. She told CSW that she heard through the "grapevine," that she would not be allowed to parent, if the infant was positive for MJ. She also talked about inability to give the child a life that she deserves & thought adoption would be the best decision. Now, pt is asking if she can parent the baby & change her mind about adoption.   CSW informed pt of her rights & [place] adoption laws. She is ambivalent at this time about parenting or making an adoption plan. She does not have any supplies for the infant but states she has the resources to purchase supplies. Paternity is unknown. Pt gave CSW verbal permission to contact her MGM, [name] [name] [phone] to inquire about any concerns she may have about pt's situation. [name] [name] told CSW that the pt has a history of substance abuse (cocaine & meth) & several previous incarcerations. She told CSW that her daughter is caring  for pt's child due to her being incarcerated at that time. She told CSW that the person pt is living with is not her sister rather a friend. MGM expressed concern about pt parenting this baby & has encouraged pt to make an adoption plan. MGM is 34 years old & states she can not raise another child. The pt told CSW that she would like to parent if she can discharge home with infant, if she could. Then pt told CSW that she wanted to meet with adoptive parents before she made a decision. If pt decides to parent, CPS will be notified due to her substance abuse history, criminal involvement & loss of custody of other children. Pt is aware of possible involvement. CSW will continue to monitor drug screen results & facilitate discharge.   VI SOCIAL WORK PLAN  Social Work Plan   No Further Intervention Required / No Barriers to Discharge   Type of pt/family education:  If child protective services report - county:  If child protective services report - date:  Information/referral to community resources comment:  Other social work plan:   

## 2013-09-28 NOTE — Progress Notes (Signed)
UR chart review completed.  

## 2013-09-28 NOTE — H&P (Signed)
[name] [name] is a 32 y.o. female presenting for Labor . Maternal Medical History:  Reason for admission: Contractions.  Nausea.  Contractions: Onset was 3-5 hours ago.   Frequency: regular.   Perceived severity is strong.    Fetal activity: Perceived fetal activity is normal.   Last perceived fetal movement was within the past hour.    Prenatal complications: Bleeding.   Prenatal Complications - Diabetes: gestational. Diabetes is managed by diet.      OB History   Grav Para Term Preterm Abortions TAB SAB Ect Mult Living   5 3 3  1  1   3      Past Medical History  Diagnosis Date  . Heart murmur    Past Surgical History  Procedure Laterality Date  . Tonsillectomy    . Skin graft    . Tonsillectomy and adenoidectomy     Family History: family history includes Diabetes in her maternal grandmother. There is no history of Alcohol abuse. Social History:  reports that she has been smoking.  She does not have any smokeless tobacco history on file. She reports that she uses illicit drugs (Marijuana). She reports that she does not drink alcohol.   Review of Systems  Constitutional: Negative for fever.  Gastrointestinal: Positive for abdominal pain. Negative for nausea, vomiting, diarrhea and constipation.  Neurological: Negative for dizziness.  Psychiatric/Behavioral: Positive for substance abuse.    Dilation: 7 Effacement (%): 90 Station: 0 Exam by:: Judeth Horn RNC Resp. rate 20. Maternal Exam:  Uterine Assessment: Contraction strength is firm.  Contraction frequency is regular.   Abdomen: Fundal height is 38.   Estimated fetal weight is 8.   Fetal presentation: vertex  Introitus: Vulva is positive for lesion (small left labial mass/early abscess). Vagina is positive for vaginal discharge.    Fetal Exam Fetal Monitor Review: Mode: ultrasound.   Baseline rate: 140.  Variability: moderate (6-25 bpm).   Pattern: accelerations present.    Fetal State  Assessment: Category I - tracings are normal.     Physical Exam  Constitutional: She is oriented to person, place, and time. She appears well-developed and well-nourished. She appears distressed (with transition, combative, agitated).  Cardiovascular: Normal rate.   Respiratory: Effort normal.  GI: Soft. She exhibits no distension. There is no tenderness. There is no rebound and no guarding.  Genitourinary: Uterus normal. Vulva exhibits lesion (small left labial mass/early abscess). Vaginal discharge found.  Musculoskeletal: Normal range of motion.  Neurological: She is alert and oriented to person, place, and time.  Skin: Skin is warm and dry.  Psychiatric: She has a normal mood and affect.   Cervix Ant Lip/C/0/vtx  Prenatal labs: ABO, Rh: --/--/O NEG (08/19 1402) Antibody: NEG (08/19 1402) Rubella: 2.00 (08/19 1402) RPR: NON REACTIVE (08/19 1402)  HBsAg: NEGATIVE (08/19 1402)  HIV: NON REACTIVE (08/19 1402)  GBS:     Assessment/Plan: A:  SIUP at [redacted]w[redacted]d       Active Labor, Transition      Infant for adoption      Hx drug use      Gestational Diabetes  P:  Admit to [facility]       Routine labor orders       Anticipate SVD soon  [facility] 09/28/2013, 2:50 AM

## 2013-09-29 MED ORDER — PNEUMOCOCCAL VAC POLYVALENT 25 MCG/0.5ML IJ INJ
0.5000 mL | INJECTION | INTRAMUSCULAR | Status: AC
  Administered 2013-09-29: 0.5 mL via INTRAMUSCULAR
  Filled 2013-09-29: qty 0.5

## 2013-09-29 MED ORDER — RHO D IMMUNE GLOBULIN 1500 UNIT/2ML IJ SOLN
300.0000 ug | Freq: Once | INTRAMUSCULAR | Status: AC
  Administered 2013-09-29: 300 ug via INTRAMUSCULAR
  Filled 2013-09-29: qty 2

## 2013-09-29 NOTE — Plan of Care (Signed)
Problem: Discharge Progression Outcomes Goal: Barriers To Progression Addressed/Resolved Outcome: Progressing Mother plans to give up baby to an adoptive family with a private adoption. Social worker aware and is handling.

## 2013-09-29 NOTE — Progress Notes (Signed)
Post Partum Day #1 Subjective: no complaints, up ad lib, voiding, tolerating PO and + flatus Sitting on couch holding infant Extensive Social W consult yesterday given pt hx of substance abuse, criminal background and loss of custody of her other children; Originally pt wanted to est an adoption plan now is changing her mind, CSW is continuing to follow  Objective: Blood pressure 119/84, pulse 81, temperature 97.4 F (36.3 C), temperature source Oral, resp. rate 18, height 5\' 7"  (1.702 m), weight 104.781 kg (231 lb), unknown if currently breastfeeding.  Physical Exam:  General: alert, cooperative and no distress Lochia: appropriate Uterine Fundus: firm, below the umbilicus Incision: n/a DVT Evaluation: No evidence of DVT seen on physical exam.   Recent Labs  09/28/13 0240  HGB 12.4  HCT 35.9*    Assessment/Plan: Pt is a 32 y.o [id number] who is doing well Has extensive hx concerning for custody of this child Social Work consult, SW is continuing to follow re adoption, drug screen and prev incarceration  Pt is otherwise stable from a medical standpoint Plans to have BTL, possible bottle feed if she goes home with baby   LOS: 2 days   Anselm Lis 09/29/2013, 7:38 AM

## 2013-09-29 NOTE — Progress Notes (Signed)
Mother seeks percocet as often as she can have it and is going off floor to smoke so nicotine patch ordered has not been re-issued. She is giving up baby to private adoption couple, Johnnette Barrios with social work is aware.

## 2013-09-29 NOTE — Progress Notes (Signed)
I have seen and examined this patient and I agree with the above. Pt unsure of plan re infant; mentioned to me that she is still planning to place the baby for adoption. Anticipate pt's discharge in the AM 09/30/13. Cam Hai 9:24 AM 09/29/2013

## 2013-09-30 ENCOUNTER — Encounter: Payer: Self-pay | Admitting: *Deleted

## 2013-09-30 LAB — RH IG WORKUP (INCLUDES ABO/RH)
ABO/RH(D): O NEG
Gestational Age(Wks): 39
Unit division: 0

## 2013-09-30 MED ORDER — IBUPROFEN 600 MG PO TABS: 600.0000 mg | ORAL_TABLET | Freq: Four times a day (QID) | ORAL | Status: AC | PRN

## 2013-09-30 NOTE — Discharge Summary (Signed)
Obstetric Discharge Summary Reason for Admission: onset of labor Prenatal Procedures: NST and ultrasound Intrapartum Procedures: spontaneous vaginal delivery Postpartum Procedures: none Complications-Operative and Postpartum: none Hemoglobin  Date Value Range Status  09/28/2013 12.4  12.0 - 15.0 g/dL Final     HCT  Date Value Range Status  09/28/2013 35.9* 36.0 - 46.0 % Final    Physical Exam:  General: alert, cooperative, appears stated age and no distress Lochia: appropriate Uterine Fundus: firm DVT Evaluation: No evidence of DVT seen on physical exam. Negative Homan's sign. No cords or calf tenderness. No significant calf/ankle edema.  Discharge Diagnoses: Term Pregnancy-delivered  Discharge Information: Date: 09/30/2013 Activity: unrestricted Diet: routine Medications: Ibuprofen and Percocet Condition: stable Instructions: refer to practice specific booklet Discharge to: home   Newborn Data: Live born female  Birth Weight: 7 lb 10.6 oz (3475 g) APGAR: 8, 9  Home with adoptive family.  Bing Plume 09/30/2013, 7:17 AM  I have seen and examined this patient and agree the above assessment. CRESENZO-DISHMAN,Elex Mainwaring 09/30/2013 7:40 AM

## 2013-10-01 ENCOUNTER — Inpatient Hospital Stay (HOSPITAL_COMMUNITY): Admission: RE | Admit: 2013-10-01 | Payer: Medicaid Other | Source: Ambulatory Visit

## 2013-10-02 LAB — TYPE AND SCREEN
ABO/RH(D): O NEG
Antibody Screen: POSITIVE
DAT, IgG: NEGATIVE
Unit division: 0
Unit division: 0

## 2013-11-04 ENCOUNTER — Ambulatory Visit: Payer: Medicaid Other | Admitting: Family Medicine

## 2014-09-21 IMAGING — US US OB LIMITED
1 series · 14 of 20 positions shown · non-contrast
Comparison: none

CLINICAL DATA: Pelvic pain.  Lack of fetal movement.

[Series 1: us ob limited · 0.33mm/px · 20 acquisitions, 14 frames shown]
[im 1/20]
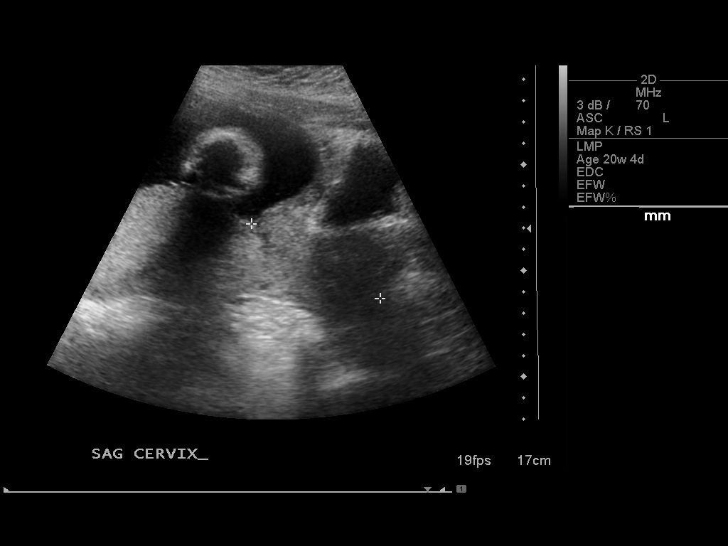
[im 3/20]
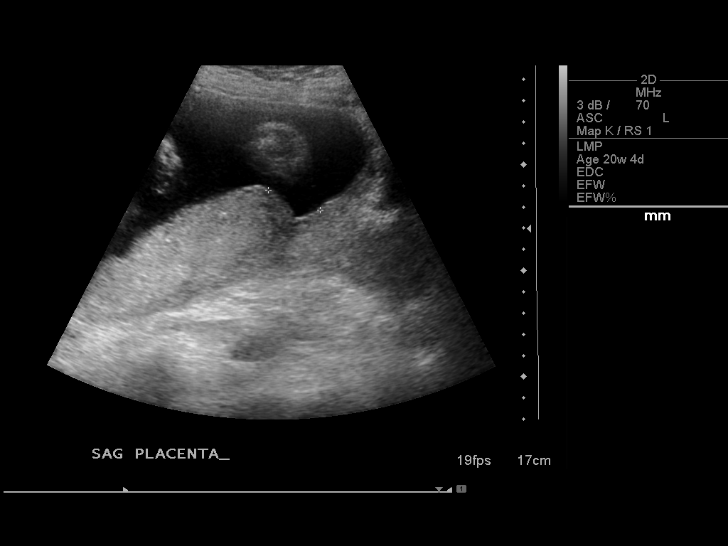
[im 4/20]
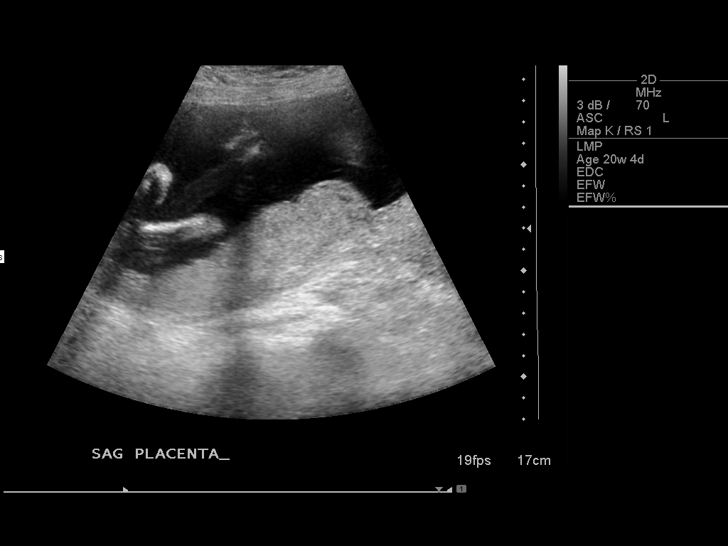
[im 6/20]
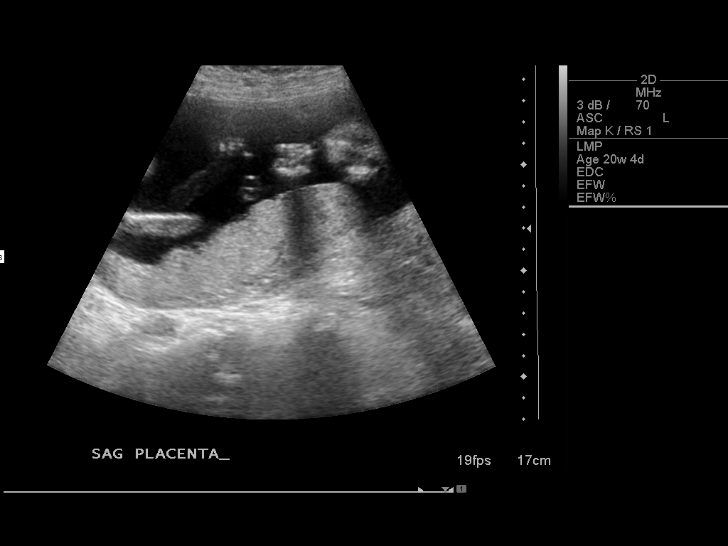
[im 7/20]
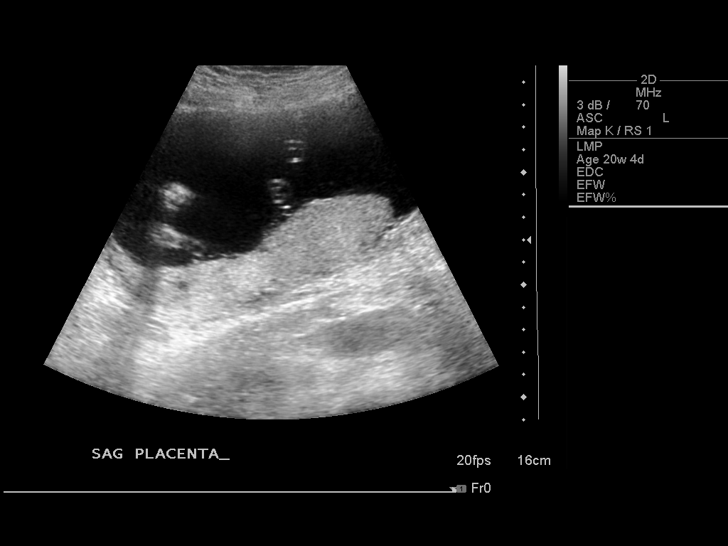
[im 8/20]
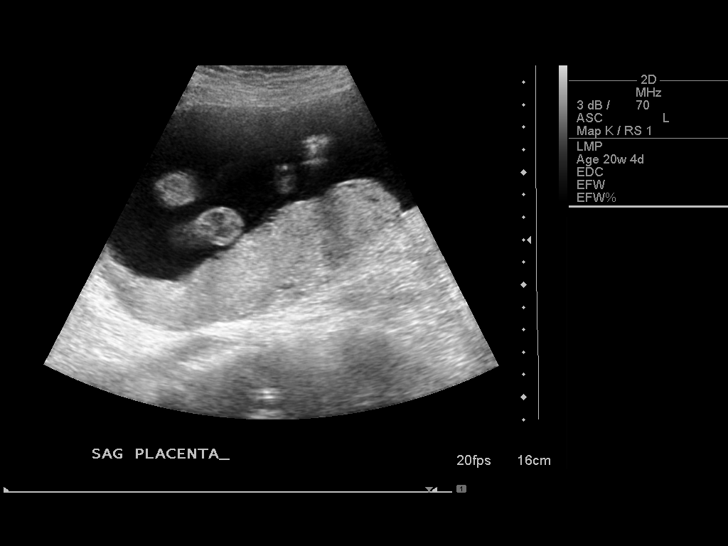
[im 10/20]
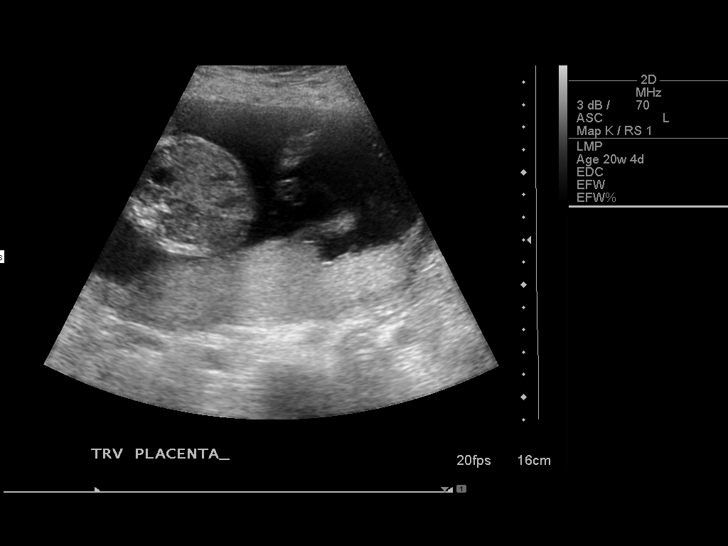
[im 11/20]
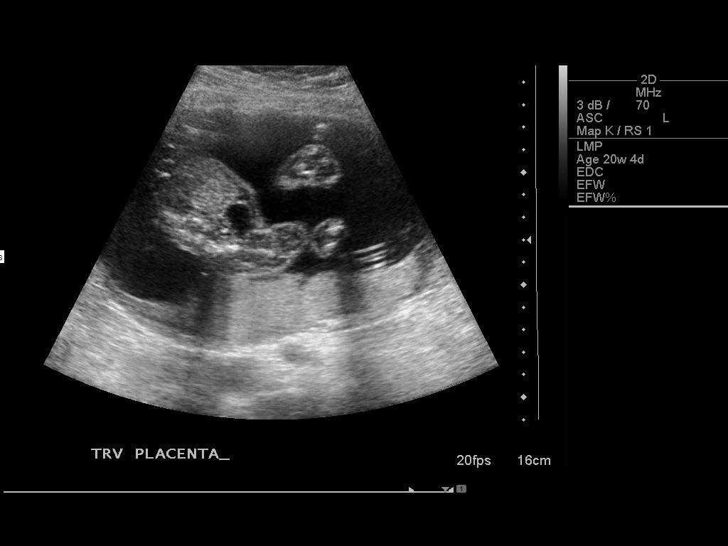
[im 13/20]
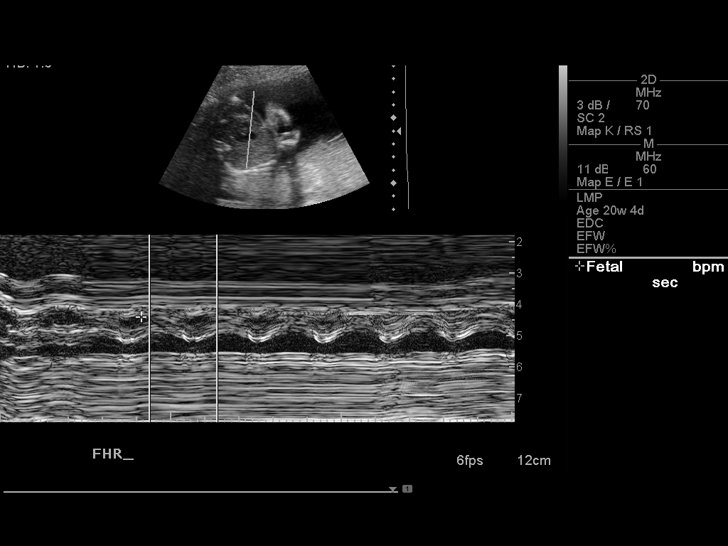
[im 14/20]
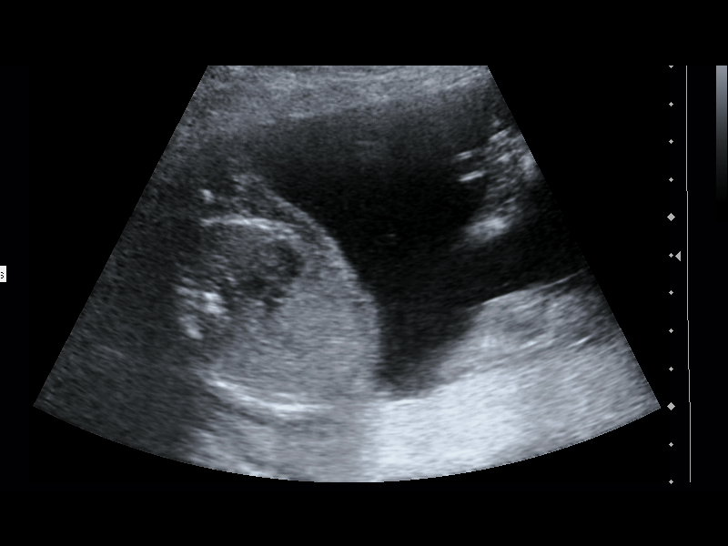
[im 16/20]
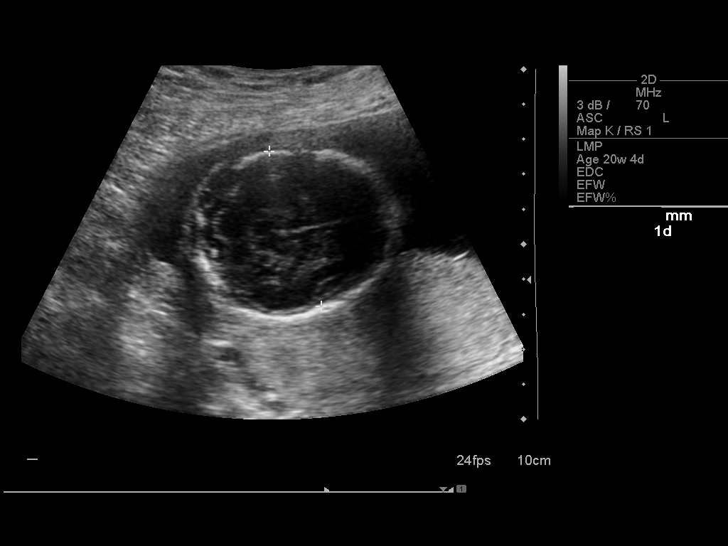
[im 17/20]
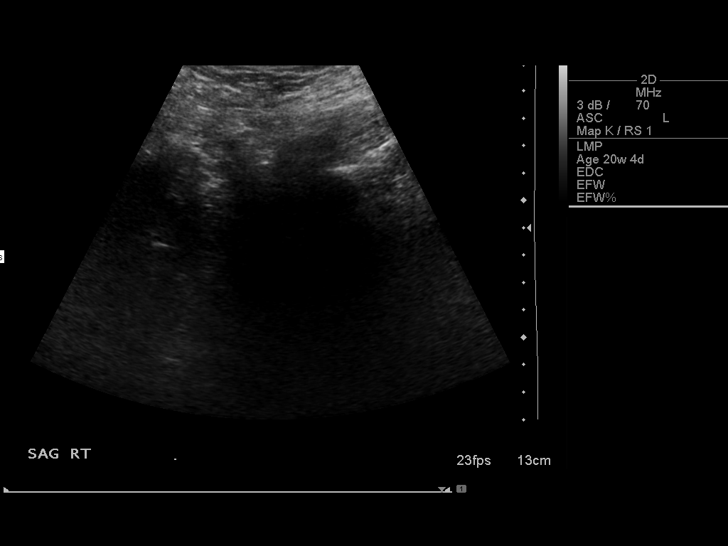
[im 18/20]
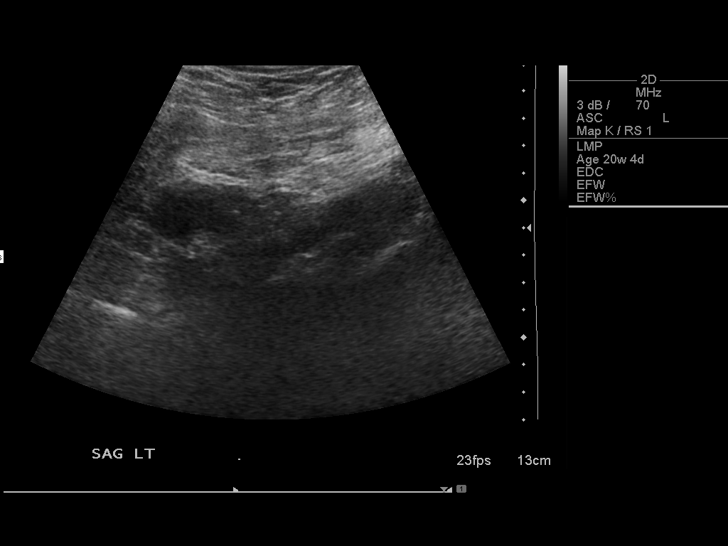
[im 20/20]
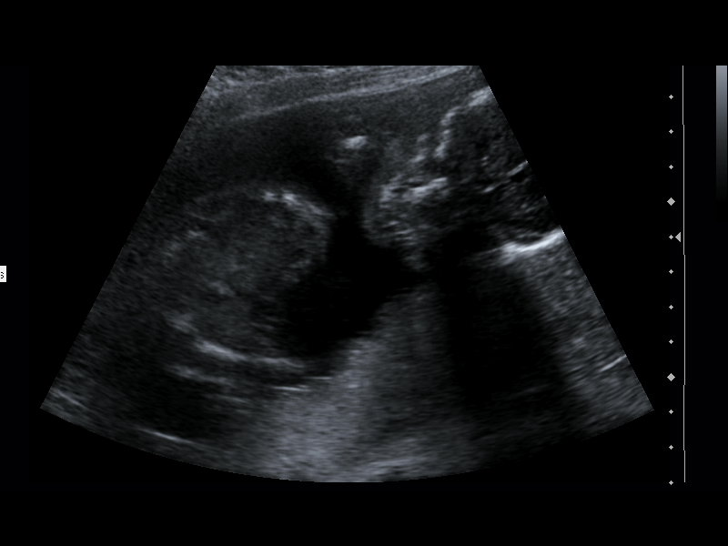

[14 of 20 positions shown; findings below may reference images not displayed]

LIMITED OBSTETRIC ULTRASOUND

Number of Fetuses: 1
Heart Rate: 876bpm
Movement:  Yes
Presentation: Cephalic
Placental Location: Posterior
Previa: No
Amniotic Fluid (Subjective): Within normal limits
Vertical pocket:  7.0cm

BPD:  4.7cm   20w    1d

MATERNAL FINDINGS:
Cervix:  Appears closed.
Uterus/Adnexae:  No abnormality identified.
IMPRESSION: Single living IUP at approximately 20 weeks gestational age.  No
acute maternal findings identified.

This exam is performed on an emergent basis and does not
comprehensively evaluate fetal size, dating, or anatomy, and a
follow-up complete OB US should be considered if further fetal
assessment is warranted.

## 2014-10-23 ENCOUNTER — Encounter (HOSPITAL_COMMUNITY): Payer: Self-pay | Admitting: Advanced Practice Midwife

## 2014-12-12 IMAGING — US US OB COMP +14 WK
1 of 2 series · 12 of 28 positions shown · non-contrast
Comparison: none

[Series 1: us ob comp +14 wk · 12 of 80 slices shown]
[im 4/80]
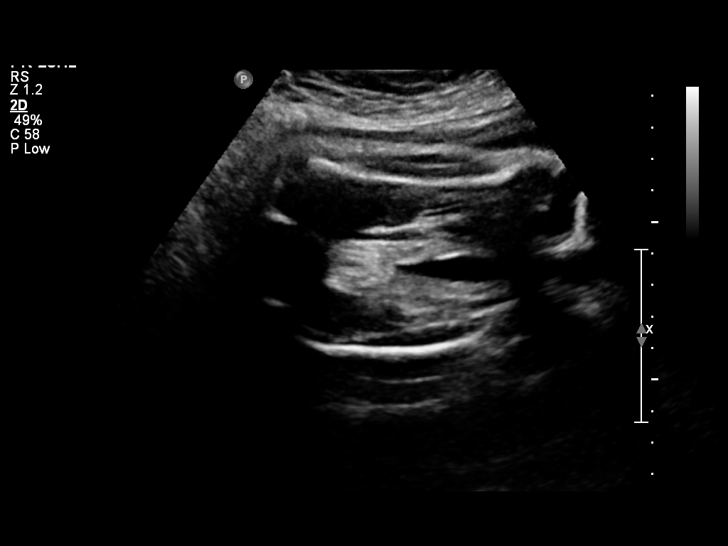
[im 10/80]
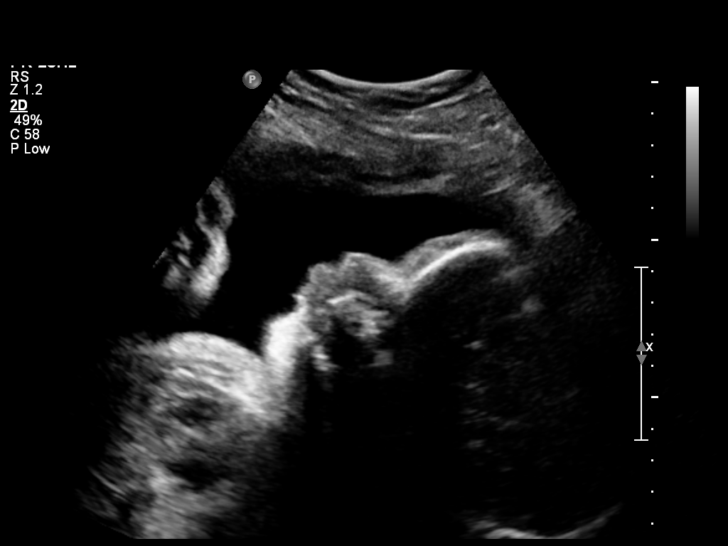
[im 16/80]
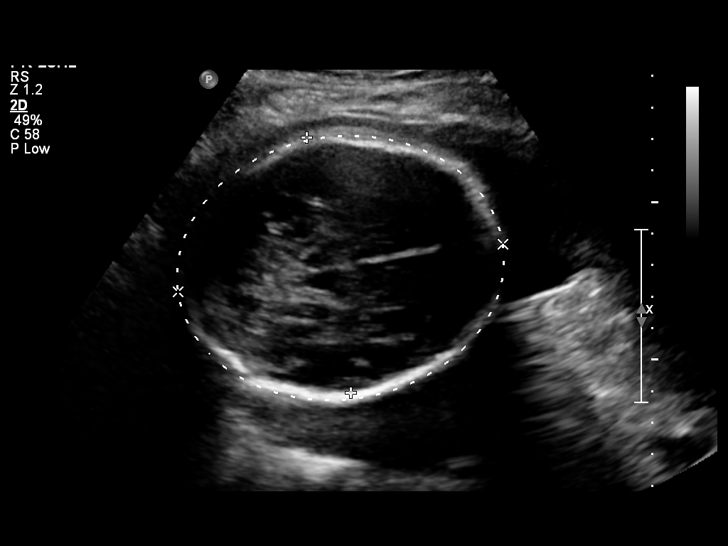
[im 25/80]
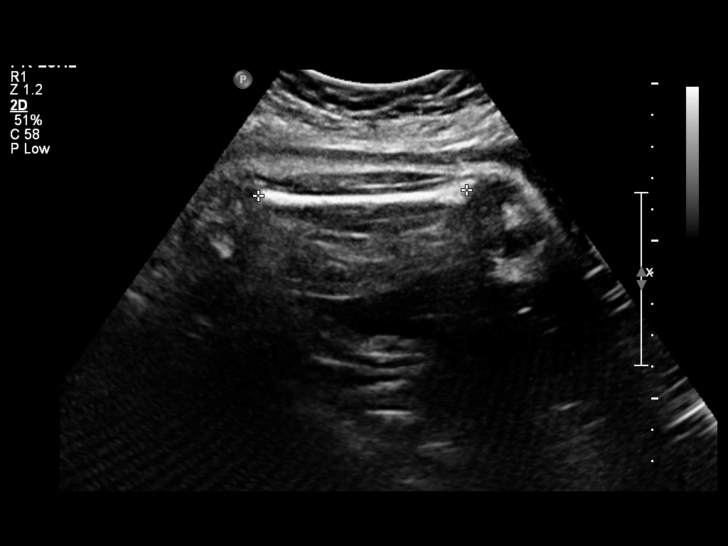
[im 31/80]
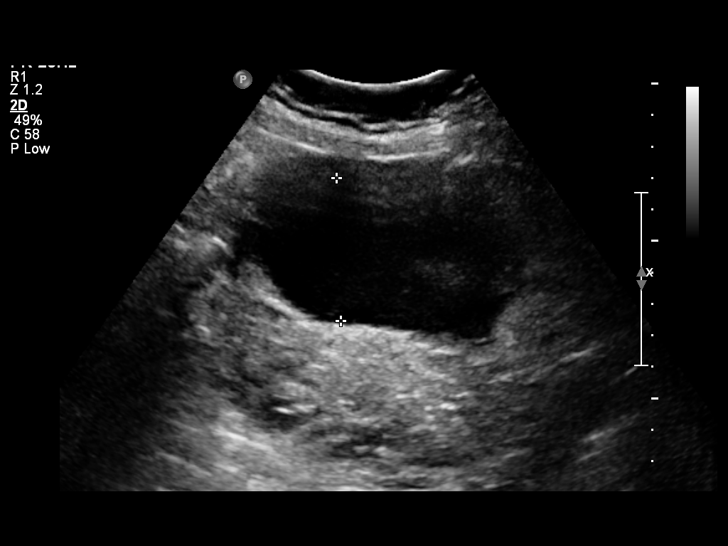
[im 37/80]
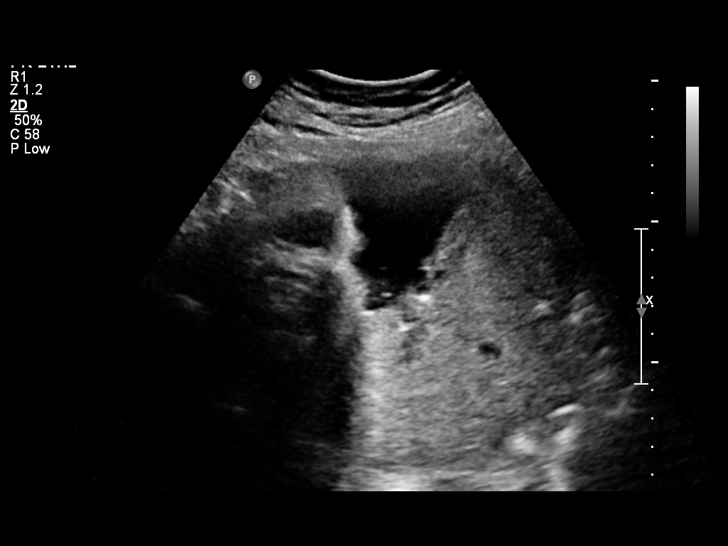
[im 46/80]
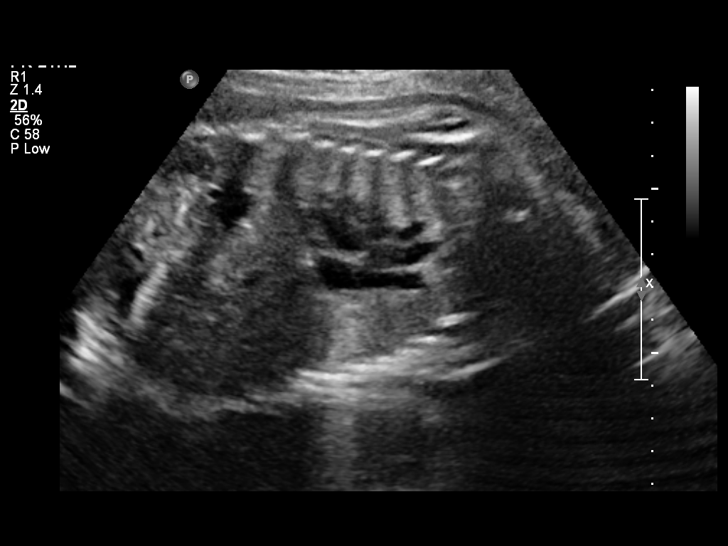
[im 52/80]
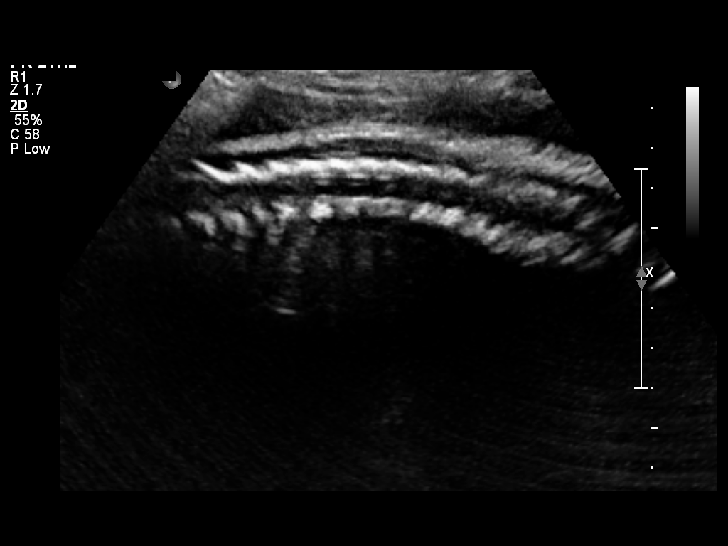
[im 58/80]
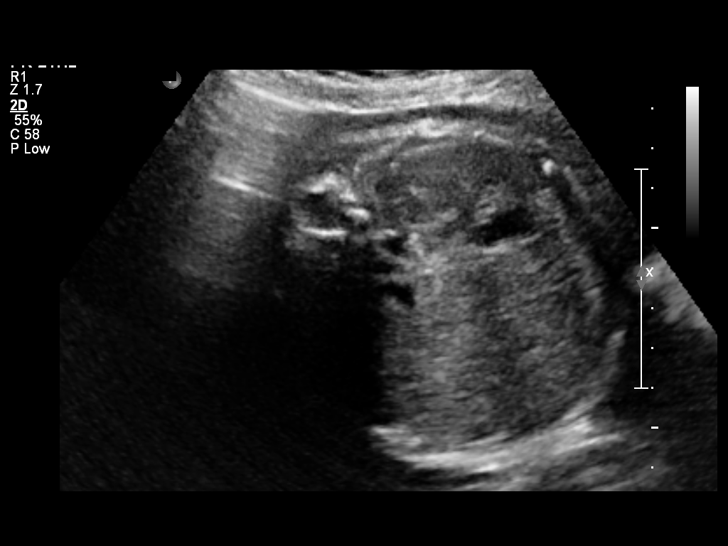
[im 67/80]
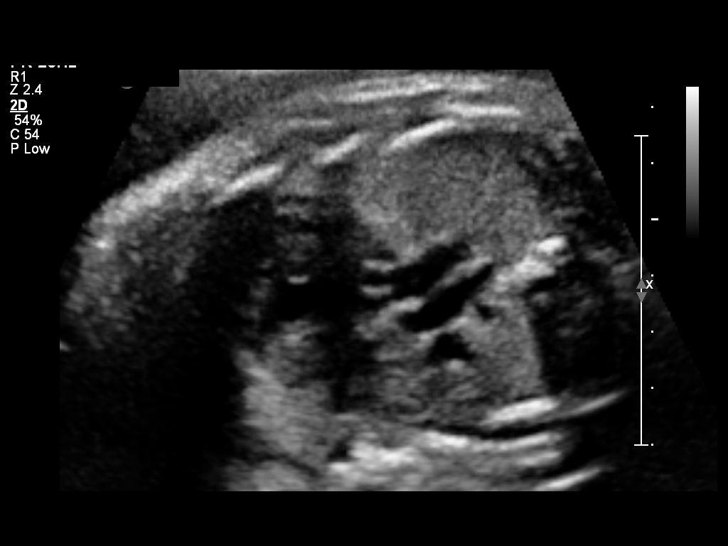
[im 73/80]
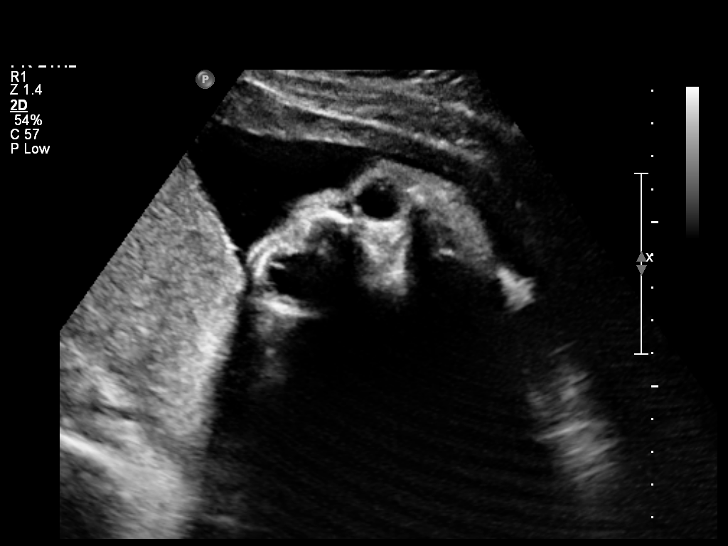
[im 80/80]
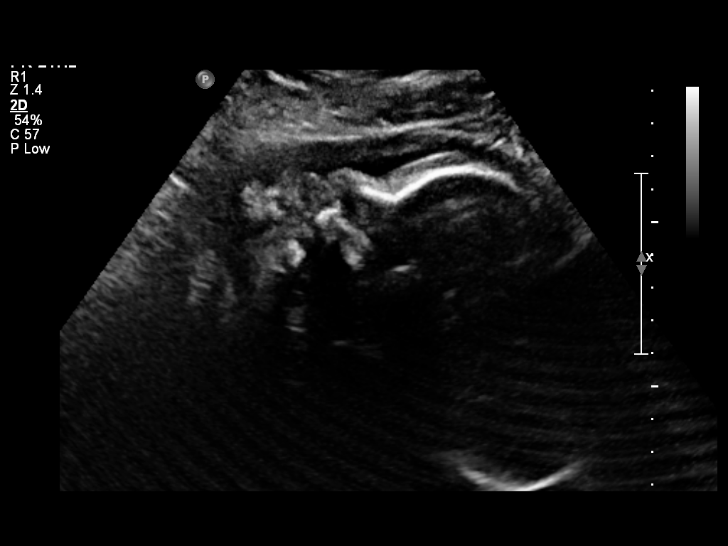

[12 of 28 positions shown; findings below may reference images not displayed]

OBSTETRICS REPORT
                      (Signed Final 08/09/2013 [DATE])

Service(s) Provided

 US OB COMP + 14 WK                                    76805.1
Indications

 Vaginal bleeding, unknown etiology
 No  Prenatal Care
Fetal Evaluation

 Num Of Fetuses:    1
 Fetal Heart Rate:  128                         bpm
 Cardiac Activity:  Observed
 Presentation:      Cephalic
 Placenta:          Posterior, above cervical
                    os
 P. Cord            Visualized
 Insertion:

 Amniotic Fluid
 AFI FV:      Subjectively within normal limits
 AFI Sum:     18.29   cm      68   %Tile     Larg Pckt:   6.59   cm
 RUQ:   4.52   cm    RLQ:    3.53   cm    LUQ:   6.59    cm   LLQ:    3.65   cm
Biometry

 BPD:     82.1  mm    G. Age:   33w 0d                CI:        75.78   70 - 86
                                                      FL/HC:      22.3   19.1 -

 HC:       299  mm    G. Age:   33w 1d       47  %    HC/AC:      1.04   0.96 -

 AC:     288.6  mm    G. Age:   32w 6d       77  %    FL/BPD:     81.1   71 - 87
 FL:      66.6  mm    G. Age:   34w 2d       91  %    FL/AC:      23.1   20 - 24
 CER:     38.7  mm    G. Age:   32w 6d       67  %
 Est. FW:    4304  gm    4 lb 12 oz      79  %
Gestational Age

 U/S Today:     33w 2d                                        EDD:   09/25/13
 Best:          31w 6d    Det. By:   Previous Ultrasound      EDD:   10/05/13
Anatomy

 Cranium:          Appears normal         Aortic Arch:      Appears normal
 Fetal Cavum:      Not well visualized    Ductal Arch:      Not well visualized
 Ventricles:       Appears normal         Diaphragm:        Appears normal
 Choroid Plexus:   Appears normal         Stomach:          Appears normal
 Cerebellum:       Appears normal         Abdomen:          Appears normal
 Posterior Fossa:  Appears normal         Abdominal Wall:   Appears nml (cord
                                                            insert, abd wall)
 Nuchal Fold:      Not applicable (>20    Cord Vessels:     Appears normal (3
                   wks GA)                                  vessel cord)
 Face:             Appears normal         Kidneys:          Appear normal
                   (orbits and profile)
 Lips:             Appears normal         Bladder:          Appears normal
 Heart:            Appears normal         Spine:            Appears normal
                   (4CH, axis, and
                   situs)
 RVOT:             Appears normal         Lower             Appears normal
                                          Extremities:
 LVOT:             Appears normal         Upper             Appears normal
                                          Extremities:

 Other:  Nasal bone visualized. Female gender. Technically difficult due to
         fetal position.
Cervix Uterus Adnexa

 Cervical Length:   3.2       cm

 Cervix:       Normal appearance by transabdominal scan.
 Left Ovary:   Not visualized.
 Right Ovary:  Not visualized.
Impression

 Single living intrauterine pregnancy in cephalic presentation.
 The estimated gestational age is 31w 6d based on Previous
 Ultrasound . Estimated fetal weight is 2168g,  79th percentile
 for gestational age of   31w 6d. Placenta is well above
 internal os, and has a normal appearance. The cervical
 length is normal. Amniotic fluid index is 18.29 cm.

## 2023-02-19 ENCOUNTER — Telehealth: Payer: Self-pay

## 2023-02-19 NOTE — Telephone Encounter (Signed)
Mychart msg sent
